# Patient Record
Sex: Male | Born: 1970
Health system: Southern US, Community
[De-identification: ages and names within clinical notes are randomized; demographics above are authoritative.]

## PROBLEM LIST (undated history)

## (undated) DIAGNOSIS — Z87442 Personal history of urinary calculi: Secondary | ICD-10-CM

## (undated) DIAGNOSIS — G43909 Migraine, unspecified, not intractable, without status migrainosus: Secondary | ICD-10-CM

## (undated) DIAGNOSIS — M1990 Unspecified osteoarthritis, unspecified site: Secondary | ICD-10-CM

## (undated) DIAGNOSIS — C189 Malignant neoplasm of colon, unspecified: Secondary | ICD-10-CM

## (undated) DIAGNOSIS — I10 Essential (primary) hypertension: Secondary | ICD-10-CM

## (undated) DIAGNOSIS — E119 Type 2 diabetes mellitus without complications: Secondary | ICD-10-CM

## (undated) HISTORY — PX: TONSILLECTOMY AND ADENOIDECTOMY: SHX28

## (undated) HISTORY — DX: Essential (primary) hypertension: I10

## (undated) HISTORY — DX: Migraine, unspecified, not intractable, without status migrainosus: G43.909

## (undated) HISTORY — DX: Malignant neoplasm of colon, unspecified: C18.9

## (undated) HISTORY — PX: TYMPANOSTOMY TUBE PLACEMENT: SHX32

## (undated) HISTORY — DX: Type 2 diabetes mellitus without complications: E11.9

## (undated) HISTORY — PX: APPENDECTOMY: SHX54

---

## 2006-06-03 ENCOUNTER — Ambulatory Visit: Payer: Self-pay | Admitting: Family Medicine

## 2006-12-20 ENCOUNTER — Ambulatory Visit: Payer: Self-pay | Admitting: Family Medicine

## 2007-01-02 ENCOUNTER — Ambulatory Visit: Payer: Self-pay | Admitting: Family Medicine

## 2007-07-21 ENCOUNTER — Ambulatory Visit: Payer: Self-pay | Admitting: Family Medicine

## 2007-08-23 ENCOUNTER — Ambulatory Visit: Payer: Self-pay | Admitting: Family Medicine

## 2007-09-11 ENCOUNTER — Encounter: Admission: RE | Admit: 2007-09-11 | Discharge: 2007-09-11 | Payer: Self-pay | Admitting: Orthopaedic Surgery

## 2007-10-10 ENCOUNTER — Ambulatory Visit: Payer: Self-pay | Admitting: Family Medicine

## 2007-11-27 ENCOUNTER — Ambulatory Visit: Payer: Self-pay | Admitting: Family Medicine

## 2007-11-30 ENCOUNTER — Encounter: Admission: RE | Admit: 2007-11-30 | Discharge: 2007-11-30 | Payer: Self-pay | Admitting: Gastroenterology

## 2007-12-01 ENCOUNTER — Encounter (INDEPENDENT_AMBULATORY_CARE_PROVIDER_SITE_OTHER): Payer: Self-pay | Admitting: Gastroenterology

## 2007-12-01 ENCOUNTER — Ambulatory Visit: Payer: Self-pay | Admitting: Hematology & Oncology

## 2007-12-01 ENCOUNTER — Inpatient Hospital Stay (HOSPITAL_COMMUNITY): Admission: AD | Admit: 2007-12-01 | Discharge: 2007-12-07 | Payer: Self-pay | Admitting: Gastroenterology

## 2007-12-02 ENCOUNTER — Encounter (INDEPENDENT_AMBULATORY_CARE_PROVIDER_SITE_OTHER): Payer: Self-pay | Admitting: Surgery

## 2007-12-07 ENCOUNTER — Ambulatory Visit: Payer: Self-pay | Admitting: Hematology & Oncology

## 2007-12-08 ENCOUNTER — Ambulatory Visit: Payer: Self-pay | Admitting: Hematology & Oncology

## 2007-12-11 ENCOUNTER — Ambulatory Visit: Admission: RE | Admit: 2007-12-11 | Discharge: 2008-02-06 | Payer: Self-pay | Admitting: Radiation Oncology

## 2007-12-12 LAB — CBC WITH DIFFERENTIAL (CANCER CENTER ONLY)
BASO#: 0.3 10*3/uL — ABNORMAL HIGH (ref 0.0–0.2)
EOS%: 1.5 % (ref 0.0–7.0)
HCT: 43.7 % (ref 38.7–49.9)
HGB: 14.5 g/dL (ref 13.0–17.1)
LYMPH#: 2.1 10*3/uL (ref 0.9–3.3)
LYMPH%: 10 % — ABNORMAL LOW (ref 14.0–48.0)
MCH: 30.8 pg (ref 28.0–33.4)
MCHC: 33.3 g/dL (ref 32.0–35.9)
MCV: 92 fL (ref 82–98)
MONO%: 4.9 % (ref 0.0–13.0)
NEUT%: 82.4 % — ABNORMAL HIGH (ref 40.0–80.0)

## 2007-12-12 LAB — COMPREHENSIVE METABOLIC PANEL
ALT: 17 U/L (ref 0–53)
AST: 16 U/L (ref 0–37)
Albumin: 3.6 g/dL (ref 3.5–5.2)
Calcium: 9.3 mg/dL (ref 8.4–10.5)
Chloride: 93 mEq/L — ABNORMAL LOW (ref 96–112)
Creatinine, Ser: 0.81 mg/dL (ref 0.40–1.50)
Potassium: 3.8 mEq/L (ref 3.5–5.3)
Sodium: 134 mEq/L — ABNORMAL LOW (ref 135–145)
Total Protein: 6.8 g/dL (ref 6.0–8.3)

## 2007-12-19 LAB — BASIC METABOLIC PANEL
Calcium: 8.4 mg/dL (ref 8.4–10.5)
Glucose, Bld: 125 mg/dL — ABNORMAL HIGH (ref 70–99)
Potassium: 4 mEq/L (ref 3.5–5.3)
Sodium: 135 mEq/L (ref 135–145)

## 2007-12-19 LAB — MANUAL DIFFERENTIAL (CHCC SATELLITE)
ALC: 0.4 10*3/uL — ABNORMAL LOW (ref 0.9–3.3)
ANC (CHCC HP manual diff): 6.3 10*3/uL (ref 1.5–6.5)
Band Neutrophils: 11 % — ABNORMAL HIGH (ref 0–10)
Myelocytes: 2 % — ABNORMAL HIGH (ref 0–0)
PLT EST ~~LOC~~: ADEQUATE

## 2007-12-19 LAB — CBC WITH DIFFERENTIAL (CANCER CENTER ONLY)
HCT: 45.4 % (ref 38.7–49.9)
HGB: 15.4 g/dL (ref 13.0–17.1)
MCH: 30.9 pg (ref 28.0–33.4)
MCV: 91 fL (ref 82–98)
RBC: 4.98 10*6/uL (ref 4.20–5.70)
WBC: 7.3 10*3/uL (ref 4.0–10.0)

## 2007-12-26 LAB — CBC WITH DIFFERENTIAL (CANCER CENTER ONLY)
BASO#: 0.1 10*3/uL (ref 0.0–0.2)
EOS%: 3.7 % (ref 0.0–7.0)
Eosinophils Absolute: 0.3 10*3/uL (ref 0.0–0.5)
HGB: 14.2 g/dL (ref 13.0–17.1)
LYMPH#: 1 10*3/uL (ref 0.9–3.3)
MCH: 30.5 pg (ref 28.0–33.4)
MONO%: 9.4 % (ref 0.0–13.0)
NEUT#: 5.3 10*3/uL (ref 1.5–6.5)
Platelets: 180 10*3/uL (ref 145–400)
RBC: 4.66 10*6/uL (ref 4.20–5.70)

## 2007-12-26 LAB — COMPREHENSIVE METABOLIC PANEL
ALT: 20 U/L (ref 0–53)
AST: 14 U/L (ref 0–37)
Albumin: 3.3 g/dL — ABNORMAL LOW (ref 3.5–5.2)
Alkaline Phosphatase: 98 U/L (ref 39–117)
BUN: 13 mg/dL (ref 6–23)
Chloride: 96 mEq/L (ref 96–112)
Potassium: 3.8 mEq/L (ref 3.5–5.3)

## 2007-12-26 LAB — UA PROTEIN, DIPSTICK - CHCC SATELLITE: Protein, Urine: 30 mg/dL

## 2007-12-28 ENCOUNTER — Ambulatory Visit: Payer: Self-pay | Admitting: Family Medicine

## 2008-01-09 LAB — URINALYSIS, MICROSCOPIC (CHCC SATELLITE)
Ketones: NEGATIVE mg/dL
Nitrite: NEGATIVE
Protein: <30 mg/dL
Specific Gravity, Urine: 1.02 (ref 1.003–1.035)
pH: 6 (ref 4.60–8.00)

## 2008-01-09 LAB — CBC WITH DIFFERENTIAL (CANCER CENTER ONLY)
BASO#: 0 10e3/uL (ref 0.0–0.2)
BASO%: 0.5 % (ref 0.0–2.0)
EOS%: 8.4 % — ABNORMAL HIGH (ref 0.0–7.0)
Eosinophils Absolute: 0.4 10e3/uL (ref 0.0–0.5)
HCT: 38.5 % — ABNORMAL LOW (ref 38.7–49.9)
HGB: 13.4 g/dL (ref 13.0–17.1)
LYMPH#: 0.6 10e3/uL — ABNORMAL LOW (ref 0.9–3.3)
LYMPH%: 13.3 % — ABNORMAL LOW (ref 14.0–48.0)
MCH: 32 pg (ref 28.0–33.4)
MCHC: 34.9 g/dL (ref 32.0–35.9)
MCV: 92 fL (ref 82–98)
MONO#: 0.5 10e3/uL (ref 0.1–0.9)
MONO%: 10.7 % (ref 0.0–13.0)
NEUT#: 2.8 10e3/uL (ref 1.5–6.5)
NEUT%: 67.1 % (ref 40.0–80.0)
Platelets: 116 10e3/uL — ABNORMAL LOW (ref 145–400)
RBC: 4.19 10e6/uL — ABNORMAL LOW (ref 4.20–5.70)
RDW: 11 % (ref 10.5–14.6)
WBC: 4.2 10e3/uL (ref 4.0–10.0)

## 2008-01-09 LAB — COMPREHENSIVE METABOLIC PANEL
ALT: 35 U/L (ref 0–53)
AST: 29 U/L (ref 0–37)
Albumin: 3.6 g/dL (ref 3.5–5.2)
CO2: 26 mEq/L (ref 19–32)
Calcium: 8.8 mg/dL (ref 8.4–10.5)
Chloride: 100 mEq/L (ref 96–112)
Potassium: 3.7 mEq/L (ref 3.5–5.3)
Total Protein: 5.8 g/dL — ABNORMAL LOW (ref 6.0–8.3)

## 2008-01-16 LAB — BASIC METABOLIC PANEL - CANCER CENTER ONLY
Calcium: 9.1 mg/dL (ref 8.0–10.3)
Creat: 0.7 mg/dl (ref 0.6–1.2)

## 2008-01-16 LAB — CBC WITH DIFFERENTIAL (CANCER CENTER ONLY)
BASO#: 0 10*3/uL (ref 0.0–0.2)
EOS%: 9.3 % — ABNORMAL HIGH (ref 0.0–7.0)
HGB: 12.7 g/dL — ABNORMAL LOW (ref 13.0–17.1)
MCH: 32.3 pg (ref 28.0–33.4)
MCHC: 35.3 g/dL (ref 32.0–35.9)
MONO%: 11.9 % (ref 0.0–13.0)
NEUT#: 1.8 10*3/uL (ref 1.5–6.5)
RDW: 11.5 % (ref 10.5–14.6)

## 2008-01-23 ENCOUNTER — Ambulatory Visit: Payer: Self-pay | Admitting: Hematology & Oncology

## 2008-01-23 LAB — CBC WITH DIFFERENTIAL (CANCER CENTER ONLY)
BASO#: 0 10*3/uL (ref 0.0–0.2)
Eosinophils Absolute: 0.2 10*3/uL (ref 0.0–0.5)
HGB: 12.4 g/dL — ABNORMAL LOW (ref 13.0–17.1)
MCV: 94 fL (ref 82–98)
MONO#: 0.5 10*3/uL (ref 0.1–0.9)
NEUT#: 1.7 10*3/uL (ref 1.5–6.5)
Platelets: 177 10*3/uL (ref 145–400)
RBC: 3.83 10*6/uL — ABNORMAL LOW (ref 4.20–5.70)
WBC: 3.1 10*3/uL — ABNORMAL LOW (ref 4.0–10.0)

## 2008-03-04 ENCOUNTER — Ambulatory Visit (HOSPITAL_COMMUNITY): Admission: RE | Admit: 2008-03-04 | Discharge: 2008-03-04 | Payer: Self-pay | Admitting: Hematology & Oncology

## 2008-03-08 LAB — CBC WITH DIFFERENTIAL (CANCER CENTER ONLY)
BASO#: 0.1 10e3/uL (ref 0.0–0.2)
BASO%: 1.2 % (ref 0.0–2.0)
EOS%: 3.1 % (ref 0.0–7.0)
Eosinophils Absolute: 0.1 10e3/uL (ref 0.0–0.5)
HCT: 42.8 % (ref 38.7–49.9)
HGB: 15 g/dL (ref 13.0–17.1)
LYMPH#: 1.1 10e3/uL (ref 0.9–3.3)
LYMPH%: 29.1 % (ref 14.0–48.0)
MCH: 33.1 pg (ref 28.0–33.4)
MCHC: 35 g/dL (ref 32.0–35.9)
MCV: 95 fL (ref 82–98)
MONO#: 0.3 10e3/uL (ref 0.1–0.9)
MONO%: 8.5 % (ref 0.0–13.0)
NEUT#: 2.2 10e3/uL (ref 1.5–6.5)
NEUT%: 58.1 % (ref 40.0–80.0)
Platelets: 197 10e3/uL (ref 145–400)
RBC: 4.51 10e6/uL (ref 4.20–5.70)
RDW: 12.3 % (ref 10.5–14.6)
WBC: 3.8 10e3/uL — ABNORMAL LOW (ref 4.0–10.0)

## 2008-03-08 LAB — COMPREHENSIVE METABOLIC PANEL
AST: 24 U/L (ref 0–37)
Albumin: 4.3 g/dL (ref 3.5–5.2)
BUN: 14 mg/dL (ref 6–23)
CO2: 26 mEq/L (ref 19–32)
Calcium: 8.9 mg/dL (ref 8.4–10.5)
Chloride: 106 mEq/L (ref 96–112)
Creatinine, Ser: 0.67 mg/dL (ref 0.40–1.50)
Glucose, Bld: 80 mg/dL (ref 70–99)
Potassium: 3.7 mEq/L (ref 3.5–5.3)

## 2008-04-01 HISTORY — PX: COLON SURGERY: SHX602

## 2008-05-01 ENCOUNTER — Ambulatory Visit: Payer: Self-pay | Admitting: Hematology & Oncology

## 2008-05-02 LAB — COMPREHENSIVE METABOLIC PANEL
ALT: 13 U/L (ref 0–53)
AST: 16 U/L (ref 0–37)
Albumin: 4.1 g/dL (ref 3.5–5.2)
Alkaline Phosphatase: 74 U/L (ref 39–117)
BUN: 13 mg/dL (ref 6–23)
Calcium: 9 mg/dL (ref 8.4–10.5)
Chloride: 101 mEq/L (ref 96–112)
Potassium: 3.9 mEq/L (ref 3.5–5.3)
Sodium: 138 mEq/L (ref 135–145)
Total Protein: 6.5 g/dL (ref 6.0–8.3)

## 2008-05-02 LAB — CBC WITH DIFFERENTIAL (CANCER CENTER ONLY)
BASO#: 0 10*3/uL (ref 0.0–0.2)
BASO%: 0.3 % (ref 0.0–2.0)
HCT: 39.8 % (ref 38.7–49.9)
HGB: 13.8 g/dL (ref 13.0–17.1)
LYMPH#: 1.1 10*3/uL (ref 0.9–3.3)
LYMPH%: 22.2 % (ref 14.0–48.0)
MCV: 92 fL (ref 82–98)
MONO#: 0.4 10*3/uL (ref 0.1–0.9)
NEUT%: 66.4 % (ref 40.0–80.0)
RDW: 10.8 % (ref 10.5–14.6)
WBC: 4.7 10*3/uL (ref 4.0–10.0)

## 2008-05-02 LAB — URINALYSIS, MICROSCOPIC (CHCC SATELLITE)
Ketones: NEGATIVE mg/dL
Nitrite: NEGATIVE
Protein: <30 mg/dL
pH: 7 (ref 4.60–8.00)

## 2008-06-05 LAB — CBC WITH DIFFERENTIAL (CANCER CENTER ONLY)
Eosinophils Absolute: 0.1 10*3/uL (ref 0.0–0.5)
HCT: 44.3 % (ref 38.7–49.9)
LYMPH#: 1.2 10*3/uL (ref 0.9–3.3)
LYMPH%: 23.3 % (ref 14.0–48.0)
MCV: 90 fL (ref 82–98)
MONO#: 0.4 10*3/uL (ref 0.1–0.9)
NEUT%: 67.2 % (ref 40.0–80.0)
RBC: 4.91 10*6/uL (ref 4.20–5.70)
WBC: 5.3 10*3/uL (ref 4.0–10.0)

## 2008-06-05 LAB — COMPREHENSIVE METABOLIC PANEL
ALT: 15 U/L (ref 0–53)
BUN: 17 mg/dL (ref 6–23)
CO2: 25 mEq/L (ref 19–32)
Calcium: 9.2 mg/dL (ref 8.4–10.5)
Creatinine, Ser: 0.76 mg/dL (ref 0.40–1.50)
Total Bilirubin: 0.6 mg/dL (ref 0.3–1.2)

## 2008-06-05 LAB — CEA: CEA: <0.5 ng/mL (ref 0.0–5.0)

## 2008-06-13 LAB — CBC WITH DIFFERENTIAL (CANCER CENTER ONLY)
BASO#: 0 10*3/uL (ref 0.0–0.2)
BASO%: 0.6 % (ref 0.0–2.0)
EOS%: 2.3 % (ref 0.0–7.0)
HCT: 41.9 % (ref 38.7–49.9)
HGB: 14.7 g/dL (ref 13.0–17.1)
LYMPH#: 1 10*3/uL (ref 0.9–3.3)
LYMPH%: 25.6 % (ref 14.0–48.0)
MCH: 31.7 pg (ref 28.0–33.4)
MCHC: 35.2 g/dL (ref 32.0–35.9)
MCV: 90 fL (ref 82–98)
MONO%: 7.6 % (ref 0.0–13.0)
NEUT%: 63.9 % (ref 40.0–80.0)
RDW: 11.4 % (ref 10.5–14.6)

## 2008-07-03 ENCOUNTER — Ambulatory Visit: Payer: Self-pay | Admitting: Hematology & Oncology

## 2008-07-25 LAB — CBC WITH DIFFERENTIAL (CANCER CENTER ONLY)
BASO#: 0 10*3/uL (ref 0.0–0.2)
EOS%: 6.6 % (ref 0.0–7.0)
HGB: 14.1 g/dL (ref 13.0–17.1)
MCH: 33 pg (ref 28.0–33.4)
MCHC: 34.3 g/dL (ref 32.0–35.9)
MONO%: 17.9 % — ABNORMAL HIGH (ref 0.0–13.0)
NEUT#: 0.7 10*3/uL — ABNORMAL LOW (ref 1.5–6.5)

## 2008-07-25 LAB — COMPREHENSIVE METABOLIC PANEL
Albumin: 4.2 g/dL (ref 3.5–5.2)
BUN: 15 mg/dL (ref 6–23)
CO2: 24 mEq/L (ref 19–32)
Calcium: 8.7 mg/dL (ref 8.4–10.5)
Chloride: 105 mEq/L (ref 96–112)
Glucose, Bld: 99 mg/dL (ref 70–99)
Potassium: 4.1 mEq/L (ref 3.5–5.3)
Sodium: 139 mEq/L (ref 135–145)
Total Protein: 6.4 g/dL (ref 6.0–8.3)

## 2008-08-01 LAB — CBC WITH DIFFERENTIAL (CANCER CENTER ONLY)
BASO%: 0.5 % (ref 0.0–2.0)
EOS%: 2.4 % (ref 0.0–7.0)
Eosinophils Absolute: 0.1 10*3/uL (ref 0.0–0.5)
MCH: 33.5 pg — ABNORMAL HIGH (ref 28.0–33.4)
MCHC: 34.6 g/dL (ref 32.0–35.9)
MONO%: 19.7 % — ABNORMAL HIGH (ref 0.0–13.0)
NEUT#: 1 10*3/uL — ABNORMAL LOW (ref 1.5–6.5)
Platelets: 127 10*3/uL — ABNORMAL LOW (ref 145–400)
RBC: 4.13 10*6/uL — ABNORMAL LOW (ref 4.20–5.70)
RDW: 13.9 % (ref 10.5–14.6)

## 2008-08-01 LAB — CMP (CANCER CENTER ONLY)
ALT(SGPT): 43 U/L (ref 10–47)
AST: 46 U/L — ABNORMAL HIGH (ref 11–38)
Albumin: 3.7 g/dL (ref 3.3–5.5)
Alkaline Phosphatase: 78 U/L (ref 26–84)
Calcium: 9.3 mg/dL (ref 8.0–10.3)
Chloride: 98 mEq/L (ref 98–108)
Potassium: 3.8 mEq/L (ref 3.3–4.7)

## 2008-08-15 LAB — CBC WITH DIFFERENTIAL (CANCER CENTER ONLY)
BASO%: 1 % (ref 0.0–2.0)
Eosinophils Absolute: 0.1 10*3/uL (ref 0.0–0.5)
LYMPH#: 1 10*3/uL (ref 0.9–3.3)
MONO#: 0.4 10*3/uL (ref 0.1–0.9)
NEUT#: 1.4 10*3/uL — ABNORMAL LOW (ref 1.5–6.5)
Platelets: 168 10*3/uL (ref 145–400)
RBC: 4.41 10*6/uL (ref 4.20–5.70)
WBC: 2.9 10*3/uL — ABNORMAL LOW (ref 4.0–10.0)

## 2008-09-10 ENCOUNTER — Ambulatory Visit (HOSPITAL_COMMUNITY): Admission: RE | Admit: 2008-09-10 | Discharge: 2008-09-10 | Payer: Self-pay | Admitting: Hematology & Oncology

## 2008-09-26 ENCOUNTER — Ambulatory Visit: Payer: Self-pay | Admitting: Hematology & Oncology

## 2008-09-27 LAB — CBC WITH DIFFERENTIAL (CANCER CENTER ONLY)
BASO#: 0 10*3/uL (ref 0.0–0.2)
EOS%: 1.6 % (ref 0.0–7.0)
HCT: 43.9 % (ref 38.7–49.9)
HGB: 15.1 g/dL (ref 13.0–17.1)
LYMPH#: 1 10*3/uL (ref 0.9–3.3)
MCH: 34.9 pg — ABNORMAL HIGH (ref 28.0–33.4)
MCHC: 34.4 g/dL (ref 32.0–35.9)
MCV: 101 fL — ABNORMAL HIGH (ref 82–98)
MONO%: 7.3 % (ref 0.0–13.0)
NEUT%: 61 % (ref 40.0–80.0)

## 2008-09-27 LAB — COMPREHENSIVE METABOLIC PANEL
ALT: 22 U/L (ref 0–53)
AST: 19 U/L (ref 0–37)
Alkaline Phosphatase: 77 U/L (ref 39–117)
Chloride: 105 mEq/L (ref 96–112)
Creatinine, Ser: 0.66 mg/dL (ref 0.40–1.50)
Total Bilirubin: 0.4 mg/dL (ref 0.3–1.2)

## 2008-11-08 ENCOUNTER — Ambulatory Visit: Payer: Self-pay | Admitting: Family Medicine

## 2008-12-18 ENCOUNTER — Ambulatory Visit (HOSPITAL_COMMUNITY): Admission: RE | Admit: 2008-12-18 | Discharge: 2008-12-18 | Payer: Self-pay | Admitting: Hematology & Oncology

## 2008-12-26 ENCOUNTER — Ambulatory Visit: Payer: Self-pay | Admitting: Hematology & Oncology

## 2008-12-27 LAB — COMPREHENSIVE METABOLIC PANEL
ALT: 29 U/L (ref 0–53)
AST: 23 U/L (ref 0–37)
Albumin: 4.3 g/dL (ref 3.5–5.2)
BUN: 17 mg/dL (ref 6–23)
Calcium: 9.3 mg/dL (ref 8.4–10.5)
Chloride: 104 mEq/L (ref 96–112)
Potassium: 4.2 mEq/L (ref 3.5–5.3)
Sodium: 140 mEq/L (ref 135–145)
Total Protein: 6.6 g/dL (ref 6.0–8.3)

## 2008-12-27 LAB — CBC WITH DIFFERENTIAL (CANCER CENTER ONLY)
BASO#: 0 10*3/uL (ref 0.0–0.2)
HCT: 45 % (ref 38.7–49.9)
HGB: 15.6 g/dL (ref 13.0–17.1)
LYMPH#: 1.3 10*3/uL (ref 0.9–3.3)
MONO#: 0.2 10*3/uL (ref 0.1–0.9)
NEUT%: 49.9 % (ref 40.0–80.0)
WBC: 3.4 10*3/uL — ABNORMAL LOW (ref 4.0–10.0)

## 2009-02-06 ENCOUNTER — Ambulatory Visit: Payer: Self-pay | Admitting: Hematology & Oncology

## 2009-03-21 ENCOUNTER — Ambulatory Visit (HOSPITAL_COMMUNITY): Admission: RE | Admit: 2009-03-21 | Discharge: 2009-03-21 | Payer: Self-pay | Admitting: Hematology & Oncology

## 2009-03-25 ENCOUNTER — Ambulatory Visit: Payer: Self-pay | Admitting: Hematology & Oncology

## 2009-03-26 LAB — CBC WITH DIFFERENTIAL (CANCER CENTER ONLY)
BASO%: 0.6 % (ref 0.0–2.0)
Eosinophils Absolute: 0.1 10*3/uL (ref 0.0–0.5)
HCT: 45.5 % (ref 38.7–49.9)
LYMPH#: 1.6 10*3/uL (ref 0.9–3.3)
MONO#: 0.3 10*3/uL (ref 0.1–0.9)
NEUT%: 56.4 % (ref 40.0–80.0)
Platelets: 171 10*3/uL (ref 145–400)
RBC: 4.96 10*6/uL (ref 4.20–5.70)
RDW: 11.9 % (ref 10.5–14.6)
WBC: 4.5 10*3/uL (ref 4.0–10.0)

## 2009-03-26 LAB — COMPREHENSIVE METABOLIC PANEL
AST: 18 U/L (ref 0–37)
BUN: 13 mg/dL (ref 6–23)
Calcium: 9 mg/dL (ref 8.4–10.5)
Chloride: 106 mEq/L (ref 96–112)
Creatinine, Ser: 0.94 mg/dL (ref 0.40–1.50)

## 2009-04-19 HISTORY — PX: COLON SURGERY: SHX602

## 2009-04-25 ENCOUNTER — Ambulatory Visit (HOSPITAL_COMMUNITY): Admission: RE | Admit: 2009-04-25 | Discharge: 2009-04-25 | Payer: Self-pay | Admitting: Surgery

## 2009-04-30 IMAGING — PT NM PET TUM IMG SKULL BASE T - THIGH
6 series · 25 of 25 positions shown · non-contrast
Comparison: None

CLINICAL DATA: Obstructing colon cancer

NUCLEAR MEDICINE PET SKULL BASE TO THIGH
TECHNIQUE: 15 mCi F-18 FDG was injected intravenously via the
right hand.  Full-ring PET imaging was performed from the skull
base through the mid-thighs 75  minutes after injection.  CT data
was obtained and used for attenuation correction and anatomic
localization only.  (This was not acquired as a diagnostic CT
examination.)
Fasting Blood Glucose:  122

[Series 1: pet ac · axial · 3.3mm · 4.69mm/px · z∈[-870,+0]mm · 5 of 267 slices shown]
[im 1/267]
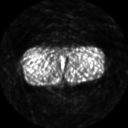
[im 67/267]
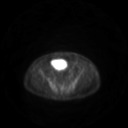
[im 134/267]
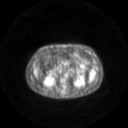
[im 200/267]
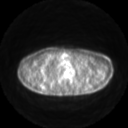
[im 267/267]
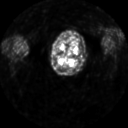

[Series 2: pet nac · axial · 3.3mm · 4.69mm/px · z∈[-870,+0]mm · 6 of 267 slices shown]
[im 1/267]
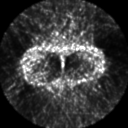
[im 54/267]
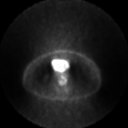
[im 107/267]
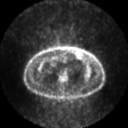
[im 160/267]
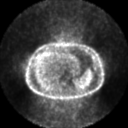
[im 213/267]
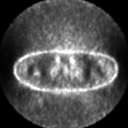
[im 267/267]
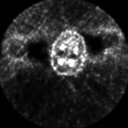

[Series 2: ct images · axial · 3.8mm · 0.98mm/px · z∈[-870,+0]mm · 5 of 263 slices shown]
[im 1/263]
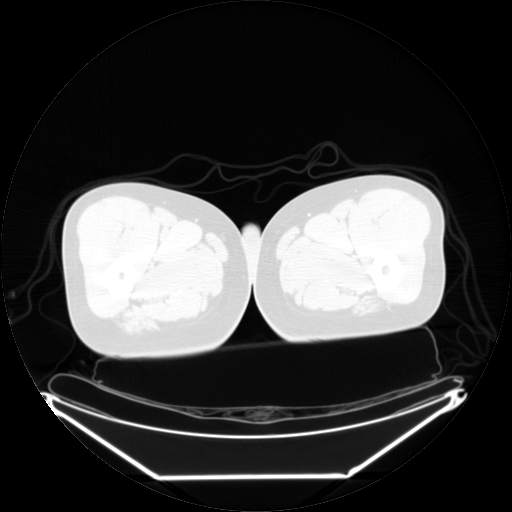
[im 66/263]
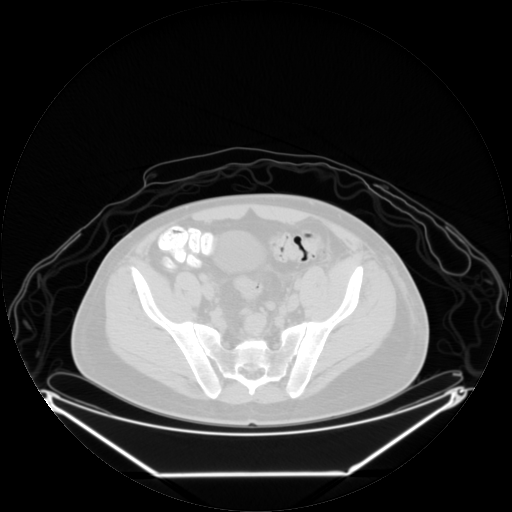
[im 132/263]
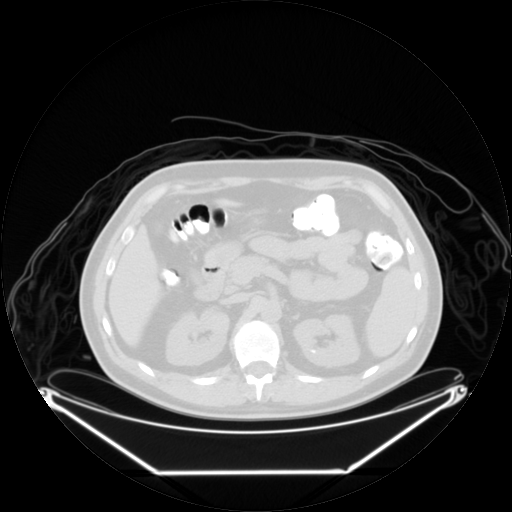
[im 197/263]
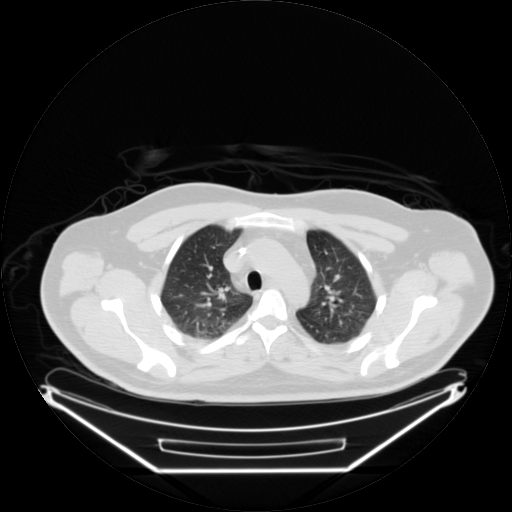
[im 263/263]
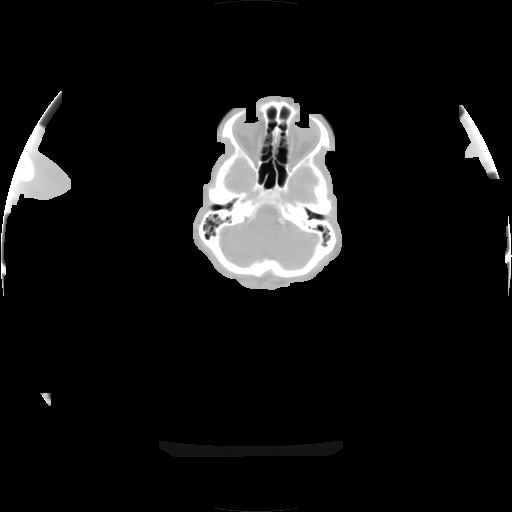

[Series 123: mip · coronal · 3.3mm · 4.69mm/px · 1 of 30 slices shown]
[im 1/30]
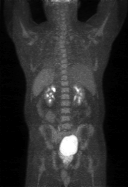

[Series 151: reformatted · axial · 3.3mm · 3.91mm/px · z∈[-870,+0]mm · 6 of 265 slices shown (1 of 2)]
[im 1/265]
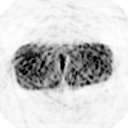
[im 53/265]
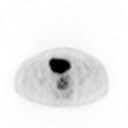
[im 106/265]
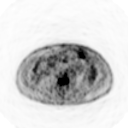
[im 159/265]
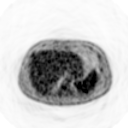
[im 212/265]
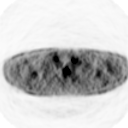
[im 265/265]
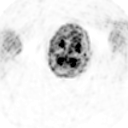

[Series 154: reformatted · coronal · 4.7mm · 6.98mm/px · 2 of 73 slices shown (2 of 2)]
[im 1/73]
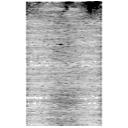
[im 73/73]
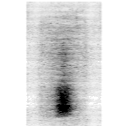

[25 of 25 positions shown; findings below may reference images not displayed]

FINDINGS: There is no hypermetabolic lymph nodes within the soft tissues of
the neck.

No hypermetabolic supraclavicular, axillary, mediastinal, or hilar
lymph nodes.

There is airspace consolidation and atelectasis within the left
base.

No hypermetabolic pulmonary nodule or mass is identified.

There is no abnormal FDG uptake within the liver parenchyma.

The adrenal glands are negative.

There is mild increased uptake within the splenic parenchyma.  The
spleen measures 14 cm in length.

The adrenal glands are negative.

The pancreas is negative.

There is no abnormal increased uptake within the retroperitoneal or
small bowel mesentery.

There is a left abdominal colostomy.

There is a small amount of free fluid within the left pericolic
gutter.  No hypermetabolic peritoneal nodules or masses noted.

Large rectal mass measures 7.2 cm and has intense F D G uptake with
an SUV max equal to 14.6.

Small pelvic lymph nodes are identified bilaterally and exhibit
abnormal F D G uptake.  For example, below the bifurcation of the
abdominal aorta there is a 12 mm lymph node which has an SUV max
equal to 4.7.

A left external iliac lymph node measures 8 mm and has an SUV max
equal to 3.7.

There is diffuse abnormal increased FDG uptake throughout the bone
marrow of the visualized axial and appendicular skeleton.  No focal
areas of dominant uptake are identified within the bone marrow.On
the corresponding CT images there are no suspicious lytic or
sclerotic lesions.
IMPRESSION: 1.  Large rectal mass is intensely hypermetabolic consistent with
primary rectal neoplasm.
2.  Borderline enlarged pelvic lymph nodes exhibit abnormal
increased F D G uptake and are concerning for metastatic disease.
3.  Diffuse abnormal increased radiotracer uptake throughout the
bone marrow.  The appearance is nonspecific and is most typically
seen in patients who have stimulated bone marrow.  In the absence
of bone marrow stimulation or exposure to chemotherapy agents
diffuse bone marrow involvement by tumor cannot be excluded.
Careful clinical correlation is advised.
4.  Splenomegaly with diffuse increased uptake throughout the
splenic parenchyma.  This may also be seen in the setting of the
stimulated bone marrow.  Splenic metastasis is considered less
favored.

## 2009-06-06 ENCOUNTER — Ambulatory Visit (HOSPITAL_COMMUNITY): Admission: RE | Admit: 2009-06-06 | Discharge: 2009-06-06 | Payer: Self-pay | Admitting: Hematology & Oncology

## 2009-06-10 ENCOUNTER — Ambulatory Visit: Payer: Self-pay | Admitting: Hematology & Oncology

## 2009-06-11 LAB — COMPREHENSIVE METABOLIC PANEL
ALT: 21 U/L (ref 0–53)
AST: 21 U/L (ref 0–37)
Albumin: 4.6 g/dL (ref 3.5–5.2)
BUN: 23 mg/dL (ref 6–23)
CO2: 29 mEq/L (ref 19–32)
Calcium: 9.4 mg/dL (ref 8.4–10.5)
Chloride: 102 mEq/L (ref 96–112)
Creatinine, Ser: 1 mg/dL (ref 0.40–1.50)
Potassium: 4.6 mEq/L (ref 3.5–5.3)

## 2009-06-11 LAB — CBC WITH DIFFERENTIAL (CANCER CENTER ONLY)
BASO%: 0.8 % (ref 0.0–2.0)
Eosinophils Absolute: 0.1 10*3/uL (ref 0.0–0.5)
HCT: 49.9 % (ref 38.7–49.9)
HGB: 16.9 g/dL (ref 13.0–17.1)
LYMPH#: 1.5 10*3/uL (ref 0.9–3.3)
LYMPH%: 35.5 % (ref 14.0–48.0)
MCV: 97 fL (ref 82–98)
MONO#: 0.3 10*3/uL (ref 0.1–0.9)
NEUT%: 55.7 % (ref 40.0–80.0)
RBC: 5.14 10*6/uL (ref 4.20–5.70)
RDW: 11.1 % (ref 10.5–14.6)
WBC: 4.4 10*3/uL (ref 4.0–10.0)

## 2009-06-11 LAB — VITAMIN D 25 HYDROXY (VIT D DEFICIENCY, FRACTURES): Vit D, 25-Hydroxy: 16 ng/mL — ABNORMAL LOW (ref 30–89)

## 2009-09-22 ENCOUNTER — Ambulatory Visit (HOSPITAL_COMMUNITY): Admission: RE | Admit: 2009-09-22 | Discharge: 2009-09-22 | Payer: Self-pay | Admitting: Hematology & Oncology

## 2009-09-29 ENCOUNTER — Ambulatory Visit: Payer: Self-pay | Admitting: Hematology & Oncology

## 2009-10-01 LAB — CBC WITH DIFFERENTIAL (CANCER CENTER ONLY)
EOS%: 3 % (ref 0.0–7.0)
LYMPH#: 1.6 10*3/uL (ref 0.9–3.3)
MCH: 32.5 pg (ref 28.0–33.4)
MCHC: 34.3 g/dL (ref 32.0–35.9)
MONO%: 6.4 % (ref 0.0–13.0)
NEUT#: 3 10*3/uL (ref 1.5–6.5)
Platelets: 217 10*3/uL (ref 145–400)
RBC: 4.8 10*6/uL (ref 4.20–5.70)

## 2009-10-01 LAB — COMPREHENSIVE METABOLIC PANEL
ALT: 22 U/L (ref 0–53)
AST: 20 U/L (ref 0–37)
Alkaline Phosphatase: 66 U/L (ref 39–117)
Potassium: 5 mEq/L (ref 3.5–5.3)
Sodium: 140 mEq/L (ref 135–145)
Total Bilirubin: 0.4 mg/dL (ref 0.3–1.2)
Total Protein: 6.5 g/dL (ref 6.0–8.3)

## 2009-10-01 LAB — CEA: CEA: 0.7 ng/mL (ref 0.0–5.0)

## 2010-02-03 ENCOUNTER — Ambulatory Visit: Payer: Self-pay | Admitting: Hematology & Oncology

## 2010-06-14 ENCOUNTER — Encounter: Payer: Self-pay | Admitting: Hematology & Oncology

## 2010-06-15 ENCOUNTER — Encounter: Payer: Self-pay | Admitting: Orthopaedic Surgery

## 2010-07-23 ENCOUNTER — Encounter: Payer: Self-pay | Admitting: Internal Medicine

## 2010-07-23 ENCOUNTER — Ambulatory Visit (INDEPENDENT_AMBULATORY_CARE_PROVIDER_SITE_OTHER): Payer: BC Managed Care – PPO | Admitting: Internal Medicine

## 2010-07-23 DIAGNOSIS — G8929 Other chronic pain: Secondary | ICD-10-CM | POA: Insufficient documentation

## 2010-07-23 DIAGNOSIS — I10 Essential (primary) hypertension: Secondary | ICD-10-CM | POA: Insufficient documentation

## 2010-07-23 DIAGNOSIS — R51 Headache: Secondary | ICD-10-CM

## 2010-07-23 DIAGNOSIS — C20 Malignant neoplasm of rectum: Secondary | ICD-10-CM | POA: Insufficient documentation

## 2010-07-23 DIAGNOSIS — J40 Bronchitis, not specified as acute or chronic: Secondary | ICD-10-CM | POA: Insufficient documentation

## 2010-07-23 MED ORDER — DOXYCYCLINE HYCLATE 100 MG PO TABS: 100.0000 mg | ORAL_TABLET | Freq: Two times a day (BID) | ORAL | Status: AC

## 2010-07-23 MED ORDER — VERAPAMIL HCL ER 180 MG PO TBCR: 180.0000 mg | EXTENDED_RELEASE_TABLET | Freq: Every day | ORAL | Status: DC

## 2010-07-23 MED ORDER — SUMATRIPTAN 5 MG/ACT NA SOLN: 1.0000 | NASAL | Status: DC | PRN

## 2010-07-23 NOTE — Assessment & Plan Note (Signed)
Resume verapamil for prophylaxis and RF imitrex spray for prn use. Recommend tobacco cessation.

## 2010-07-23 NOTE — Assessment & Plan Note (Signed)
Suboptimal control. Resume verapamil. Monitor bp as outpt and followup in clinic as scheduled.

## 2010-07-23 NOTE — Assessment & Plan Note (Signed)
Stable, asx and without evidence of recurrence to date. Continue followup with H/O.

## 2010-07-23 NOTE — Progress Notes (Signed)
  Subjective:    Patient ID: Stephen Tanner, male    DOB: 1970/12/07, 40 y.o.   MRN: [id number]  HPI  Pt presents to clinic for evaluation of cough and headaches. Notes >10year h/o chronic intermittent ha's thought to be cluster ha's. Has attempted in the past prednisone, oxygen without improvement and seems to respond to imitrex spray and verapamil prophylaxis. Often notes rapid improvement with verapamil and is able to ultimately dc the medication. Will have several months without ha's followed by return of pattern without obvious trigger. Recently notes return of ha's described as unilateral involved brow above eye and temple with duration of 1-1.5 hours. Reports previous nl cranial imaging.  States has h/o rectal adenocarcinoma followed closely by H/O. Undergoing ~q40month CT scans without evidence of recurrence.   H/o HTN that seems to respond to verapamil when takes the medication for ha prophylaxis. BP elevated and reviewed today but asx. Notes family h/o HTN diffusely. Recalls having nl thyroid blood tests previously.  Notes 2 week h/o cough, post nasal drainage and nasal congestion. Denies fever, chills or sick exposure. No alleviating or exacerbating factors. No improvement of sx's.  Reviewed PMH, PSH, medications, allergies, social hx and family hx.    Review of Systems  Constitutional: Negative for fever, chills and fatigue.  HENT: Positive for congestion, rhinorrhea and postnasal drip. Negative for ear pain, facial swelling and ear discharge.   Eyes: Negative for discharge and redness.  Respiratory: Negative for cough, shortness of breath and wheezing.   Cardiovascular: Negative for chest pain and palpitations.  Gastrointestinal: Negative for abdominal pain, blood in stool and anal bleeding.  Genitourinary: Negative for hematuria, decreased urine volume and difficulty urinating.  Musculoskeletal: Negative for arthralgias and gait problem.  Skin: Negative for color change and rash.    Neurological: Positive for headaches. Negative for dizziness, syncope, weakness and numbness.  Hematological: Negative for adenopathy. Does not bruise/bleed easily.  Psychiatric/Behavioral: Negative for behavioral problems and agitation.       Objective:   Physical Exam  [nursing notereviewed. Constitutional: He appears well-developed and well-nourished. No distress.  HENT:  Head: Normocephalic and atraumatic.  Right Ear: Tympanic membrane, external ear and ear canal normal.  Left Ear: Tympanic membrane, external ear and ear canal normal.  Nose: Nose normal.  Mouth/Throat: Oropharynx is clear and moist. No oropharyngeal exudate.  Eyes: Conjunctivae are normal. Right eye exhibits no discharge. Left eye exhibits no discharge. No scleral icterus.  Neck: Trachea normal. Neck supple.  Cardiovascular: Regular rhythm and normal heart sounds.  Tachycardia present.  Exam reveals no gallop and no friction rub.   No murmur heard. Pulmonary/Chest: Effort normal and breath sounds normal. No respiratory distress. He has no wheezes. He has no rales. He exhibits no tenderness.  Lymphadenopathy:    He has no cervical adenopathy.  Neurological: He is alert. He displays no tremor. Gait normal.  Skin: Skin is warm and dry. No rash noted. He is not diaphoretic. No erythema.  Psychiatric: He has a normal mood and affect.          Assessment & Plan:

## 2010-07-23 NOTE — Assessment & Plan Note (Signed)
Begin 7d course of doxycycline

## 2010-08-12 ENCOUNTER — Telehealth: Payer: Self-pay

## 2010-08-12 NOTE — Telephone Encounter (Signed)
Returned call to pt's wife. Rx given by Dr. Rodena Medin for Imitrex nasal spray was 5 mg and when pt got home he realized that what he previously had 20 mg so he is having to use 3 of the 5 mg sprays at a time. Wants to know if Dr. Rodena Medin will up to dosage to 20 mg

## 2010-08-13 NOTE — Telephone Encounter (Signed)
Ok to increase to 20mg . But the max daily dose i believe for the spray is 40mg 

## 2010-08-14 ENCOUNTER — Other Ambulatory Visit: Payer: Self-pay

## 2010-08-14 DIAGNOSIS — G43909 Migraine, unspecified, not intractable, without status migrainosus: Secondary | ICD-10-CM

## 2010-08-14 MED ORDER — SUMATRIPTAN 20 MG/ACT NA SOLN: 1.0000 | NASAL | Status: DC | PRN

## 2010-08-14 NOTE — Telephone Encounter (Signed)
Done. Pt's wife, Rosann Auerbach,  aware

## 2010-08-25 LAB — CBC
MCHC: 34.7 g/dL (ref 30.0–36.0)
MCV: 94.4 fL (ref 78.0–100.0)
Platelets: 209 10*3/uL (ref 150–400)
RBC: 4.83 MIL/uL (ref 4.22–5.81)
RDW: 13.2 % (ref 11.5–15.5)

## 2010-08-25 LAB — BASIC METABOLIC PANEL
BUN: 21 mg/dL (ref 6–23)
CO2: 29 mEq/L (ref 19–32)
Chloride: 102 mEq/L (ref 96–112)
Creatinine, Ser: 1.04 mg/dL (ref 0.4–1.5)
Glucose, Bld: 164 mg/dL — ABNORMAL HIGH (ref 70–99)

## 2010-08-25 LAB — DIFFERENTIAL
Basophils Absolute: 0 10*3/uL (ref 0.0–0.1)
Basophils Relative: 0 % (ref 0–1)
Eosinophils Absolute: 0.1 10*3/uL (ref 0.0–0.7)
Monocytes Relative: 7 % (ref 3–12)
Neutrophils Relative %: 62 % (ref 43–77)

## 2010-08-30 LAB — GLUCOSE, CAPILLARY: Glucose-Capillary: 113 mg/dL — ABNORMAL HIGH (ref 70–99)

## 2010-09-17 ENCOUNTER — Ambulatory Visit: Payer: BC Managed Care – PPO | Admitting: Internal Medicine

## 2010-09-18 ENCOUNTER — Encounter: Payer: Self-pay | Admitting: Internal Medicine

## 2010-09-18 ENCOUNTER — Ambulatory Visit (INDEPENDENT_AMBULATORY_CARE_PROVIDER_SITE_OTHER): Payer: BC Managed Care – PPO | Admitting: Internal Medicine

## 2010-09-18 DIAGNOSIS — I10 Essential (primary) hypertension: Secondary | ICD-10-CM

## 2010-09-18 DIAGNOSIS — R51 Headache: Secondary | ICD-10-CM

## 2010-09-18 DIAGNOSIS — J40 Bronchitis, not specified as acute or chronic: Secondary | ICD-10-CM

## 2010-09-18 DIAGNOSIS — G8929 Other chronic pain: Secondary | ICD-10-CM

## 2010-09-18 MED ORDER — DOXYCYCLINE HYCLATE 100 MG PO TABS: 100.0000 mg | ORAL_TABLET | Freq: Two times a day (BID) | ORAL | Status: DC

## 2010-09-18 MED ORDER — HYDROCOD POLST-CHLORPHEN POLST 10-8 MG/5ML PO LQCR: 5.0000 mL | Freq: Two times a day (BID) | ORAL | Status: DC | PRN

## 2010-09-18 NOTE — Assessment & Plan Note (Signed)
Off verapamil. Continue low-sodium diet exercise and weight loss. Monitor blood pressure as an outpatient and followup in clinic as scheduled.

## 2010-09-18 NOTE — Assessment & Plan Note (Signed)
Improved. Off verapamil. Continue Imitrex when necessary.

## 2010-09-18 NOTE — Assessment & Plan Note (Signed)
Begin antibiotic therapy. Followup if no improvement or worsening. 

## 2010-09-18 NOTE — Progress Notes (Signed)
  Subjective:    Patient ID: Stephen Tanner, male    DOB: 20-Jul-1970, 40 y.o.   MRN: [id number]  HPI Pt presents to clinic for evaluation of cough. Notes over [age] history of cough productive for green and brown sputum without hemoptysis. Cough is worse at night. Denies fever, chills, wheezing or shortness of breath. No sick exposures. History of migraine and cluster headaches. Has had no further cluster headaches. Imitrex seems to abort migraine headaches. Is now off verapamil and pursuing low-sodium diet exercise and weight loss. Monitor blood pressure and is improved overall. Weight down 5 pounds since last visit and blood pressure improved. Reviewed with patient. No other complaints.  Reviewed past medical history, medications and allergies.    Review of Systems See history of present illness    Objective:   Physical Exam    Physical Exam  [nursing notereviewed. Constitutional:  appears well-developed and well-nourished. No distress.  HENT:  Head: Normocephalic and atraumatic.  Right Ear: Tympanic membrane, external ear and ear canal normal.  Left Ear: Tympanic membrane, external ear and ear canal normal.  Nose: Nose normal.  Mouth/Throat: Oropharynx is clear and moist. No oropharyngeal exudate.  Eyes: Conjunctivae are normal. No scleral icterus.  Neck: Neck supple.  Cardiovascular: Normal rate, regular rhythm and normal heart sounds.  Exam reveals no gallop and no friction rub.   No murmur heard. Pulmonary/Chest: Effort normal and breath sounds normal. No respiratory distress.  no wheezes.  no rales.  Lymphadenopathy:     no cervical adenopathy.  Neurological:  alert.  Skin: Skin is warm and dry.  not diaphoretic.      Assessment & Plan:

## 2010-09-24 ENCOUNTER — Telehealth: Payer: Self-pay | Admitting: Internal Medicine

## 2010-09-24 MED ORDER — LEVOFLOXACIN 500 MG PO TABS: 500.0000 mg | ORAL_TABLET | Freq: Every day | ORAL | Status: AC

## 2010-09-24 NOTE — Telephone Encounter (Signed)
Was seen last week. Not any better. Wants something stronger without coming in. Please call [facility]. Any ? Please call.

## 2010-09-24 NOTE — Telephone Encounter (Signed)
Change doxycycline to levaquin 500mg  po qd x 7days. Check allergies and interactions.

## 2010-09-25 ENCOUNTER — Telehealth: Payer: Self-pay | Admitting: Internal Medicine

## 2010-09-25 NOTE — Telephone Encounter (Signed)
Wife stated that her husband is having more frequent migraines. According to the pharmacy, cannot refill his Imitrex until 10-12-2010. Please advise of his migraines.

## 2010-09-28 ENCOUNTER — Other Ambulatory Visit: Payer: Self-pay | Admitting: Internal Medicine

## 2010-09-28 NOTE — Telephone Encounter (Signed)
Pt is having severe migraine/cluster headaches and is needing to get early refill of Imitrex. Pt says that if this med can not req called in, pt req a different med asap to [facility] on [address].

## 2010-09-28 NOTE — Telephone Encounter (Signed)
Prior Auth for imitrex process has been initiated. Pt's wife aware.

## 2010-10-06 NOTE — Discharge Summary (Signed)
[name], Stephen Tanner NO.:  [id number]   MEDICAL RECORD NO.:  [id number]          PATIENT TYPE:  INP   LOCATION:  1321                         FACILITY:  [facility]   PHYSICIAN:  Jordan Hawks. Elnoria Howard, MD    DATE OF BIRTH:  10-Jan-1971   DATE OF ADMISSION:  12/01/2007  DATE OF DISCHARGE:  12/07/2007                               DISCHARGE SUMMARY   SURGEON:  Wilmon Arms. Corliss Skains, M.D.   ONCOLOGIST:  Rose Phi. Myna Hidalgo, M.D.   RADIATION ONCOLOGIST:  Billie Lade, M.D.   DISCHARGE DIAGNOSES:  1. Rectal cancer.  2. Status post descending colon loop colostomy.  3. Mild appendicitis, status post appendectomy.   HOSPITAL COURSE:  The patient was admitted after his EUS examination  with findings of a T3N1 or possibly N2 rectal cancer.  Biopsy did  confirm the nature of this mass that was noted in the rectum.  After the  finding, surgical consultation was obtained because of obstructive  symptoms.  Subsequently, Dr. Manus Rudd operated on the patient the  following day and performed a loop colostomy.  The procedure went  without any complications.  The patient tolerated the procedure well.  Over the intervening days, he was advanced with his diet and his stoma  did appear to be normal, and the incision sites were healing properly.  He was able to pass flatus into his ostomy bag as well as passing solid  stool into his ostomy bag on the day of discharge.  He was also  evaluated by Dr. Myna Hidalgo and Dr. Roselind Messier who both agreed that he should  have neoadjuvant therapy which will be initiated next week pending his  discharge at this time.  Of note, his white blood cell count was noted  to have decreased, but then on the last 2 days of his hospital course,  his white blood cell count climbed up to 18,000.  On the day of  discharge, it was up to 20,000.  The patient clinically appeared well.  He had no complaints of abdominal pain, nausea or vomiting.  There are  no reports of any  fever.  No issues with diarrhea to suggest C. diff  colitis.  Because the patient clinically appeared well, it is felt that  he could be safely discharged home.   PLAN:  1. Initiate chemo and radiation per Dr. Myna Hidalgo and Dr. Trina Ao      recommendations.  2. The patient is to follow up with Dr. Corliss Skains in 1 week.      Jordan Hawks Elnoria Howard, MD  Electronically Signed     PDH/MEDQ  D:  12/07/2007  T:  12/07/2007  Job:  [id number]   cc:   Wilmon Arms. Corliss Skains, M.D.  [address] [place] [address] [id number]  [place]. Myna Hidalgo, M.D.  Fax: [phone]   Billie Lade, M.D.  Fax: [phone]

## 2010-10-06 NOTE — Op Note (Signed)
Stephen Tanner, NEDVED NO.:  [id number]   MEDICAL RECORD NO.:  [id number]          PATIENT TYPE:  OIB   LOCATION:  1321                         FACILITY:  [facility]   PHYSICIAN:  Wilmon Arms. Corliss Skains, M.D. DATE OF BIRTH:  Feb 22, 1971   DATE OF PROCEDURE:  12/02/2007  DATE OF DISCHARGE:                               OPERATIVE REPORT   PREOPERATIVE DIAGNOSES:  1. Low near-obstructing rectal cancer.  2. Abnormal-appearing appendix.  3. Need for chemotherapy.   POSTOPERATIVE DIAGNOSES:  1. Low near-obstructing rectal cancer.  2. Abnormal-appearing appendix.  3. Need for chemotherapy.   PROCEDURES PERFORMED:  1. Laparoscopic-assisted diverting sigmoid colostomy.  2. Laparoscopic appendectomy.  3. Left subclavian vein PowerPort placement.   SURGEON:  Wilmon Arms. Corliss Skains, MD, FACS   ASSISTANT:  Ardeth Sportsman, MD   ANESTHESIA:  General endotracheal.   INDICATIONS:  The patient is a 40 year old male who presented with  rectal and pelvic pain.  Workup revealed a 6-cm near-obstructing low  rectal mass.  Due to the size of the mass we are consulted for surgical  evaluation.  We are planning neoadjuvant chemotherapy and radiation.  The patient's CT scan also revealed some thickening of the appendix.  We  are planning to divert his fecal stream with a loop sigmoid colostomy  due to his impending obstruction.  We are also asked to place a port.   DESCRIPTION OF PROCEDURE:  The patient brought to the operating room,  placed in supine position on the operating room table with his arms  tucked.  After an adequate level of general anesthesia was obtained, a  Foley catheter was placed under sterile technique.  The patient's  abdomen and left subclavian area were shaved.  We prepped his left chest  and neck with Betadine and draped in sterile fashion.  A time-out was  taken to assure the proper patient and proper procedure.  The patient  was positioned in Trendelenburg.  His left  subclavian vein was then  cannulated with an 18-gauge needle.  We advanced a guidewire and  fluoroscopy confirmed the wire heading down the superior vena cava.  We  then created a subcutaneous pocket inferior to the insertion site.  I  made a subcutaneous tunnel from the pouch to the insertion site.  We  used an 8-French PowerPort catheter.  This was tunneled from the pouch  to the insertion site.  We secured the port in place with two  interrupted 2-0 Prolene sutures.  I also removed some of the  subcutaneous fat anterior to the port.  The dilator and breakaway sheath  were then passed over the wire into the clavipectoral fascia.  This was  advanced into the subclavian vein.  The dilator was removed and the  catheter was inserted.  The breakaway sheath was then removed as the  catheter was advanced.  Fluoroscopy confirmed good placement with the  tip at the cavoatrial junction (we had cut the catheter at 25 cm).  The  catheter was then aspirated with good blood return and then flushed with  heparinized saline.  We  closed both wounds with 3-0 Vicryl and 4-0  Monocryl.  Steri-Strips and clean dressings were applied.  We instilled  5 mL of concentrated heparin solution into the catheter.  An occlusive  dressing was then applied.   We then took down all the drapes.  The patient's abdomen was prepped and  draped in sterile fashion.  We rescrubbed and put on sterile gown and  gloves.  I made a 1-cm vertical incision above his umbilicus.  Dissection was carried down to the fascia, which was opened vertically.  We entered the peritoneal cavity bluntly.  A stay suture of 0 Vicryl was  placed around the fascial opening.  The Hasson cannula was inserted and  secured with a stay suture.  Pneumoperitoneum was obtained by  insufflating CO2 and maintaining a maximal pressure of 15 mmHg.  The  patient was positioned in Trendelenburg, tilted to his left.  The liver  appeared grossly normal.  A 5-mm port  was placed in the right upper  quadrant.  Another 5-mm port was placed in the left lower quadrant.  We  switched to a 5-mm, 30-degree laparoscope through the right upper  quadrant port site.  We then mobilized the cecum medially.  We  identified the appendix.  The tip of appendix did appear mildly  thickened but there was no gross inflammation or purulence.  The tip of  the appendix was densely adherent down into the pelvis, into the lateral  side of the pelvis.  I began by creating a small window in the  mesoappendix at the base.  The base of the appendix was then divided  with an Endo-GIA stapler.  We then used a Harmonic Scalpel to mobilize  the remainder of the appendix distally.  Once the appendix was  completely free, we placed it in an EndoCatch sac and removed it through  the umbilical port site.  We irrigated the right lower quadrant and no  bleeding was noted.  We then turned our attention to the descending  sigmoid colon.  We placed an additional 5-mm port in the right lower  quadrant.  The patient was positioned tilted to his right.  We divided  the lateral attachments of the descending colon and sigmoid to mobilize  it.  Once we had adequate mobilization, we selected a fairly mobile area  in the distal descending colon and brought it up to the anterior  abdominal wall.  We selected a site for our diverting loop colostomy.  I  excised an ellipse of skin and subcutaneous fat.  The fascia was opened  and the muscle fibers were split.  The inferior epigastrics were taken  with the Harmonic Scalpel.  We entered the peritoneal cavity.  The  selected loop of colon was brought up into the wound.  We created a  small window behind the loop and passed a 16-French red rubber catheter.  We then removed the remainder of our laparoscopic ports.  We closed the  umbilical fascia with a pursestring suture.  Staples were used to close  the skin.  We then matured the colostomy by opening with a  cautery.  The  red rubber catheter was secured to the skin with 2-0 nylon suture.  Vicryl 3-0 was used to mature the loop colostomy circumferentially.  I  passed a finger into each limb of the loop colostomy and they were  widely patent.  An ostomy appliance was cut to fit.  The patient was  then extubated and brought  to recovery in stable condition.  All sponge,  instrument and needle counts were correct.      Wilmon Arms. Tsuei, M.D.  Electronically Signed     MKT/MEDQ  D:  12/02/2007  T:  12/02/2007  Job:  [id number]

## 2010-10-06 NOTE — H&P (Signed)
NAMEBO, TEICHER NO.:  [id number]   MEDICAL RECORD NO.:  [id number]          PATIENT TYPE:  OIB   LOCATION:  0006                         FACILITY:  [facility]   PHYSICIAN:  Jordan Hawks. Elnoria Howard, MD    DATE OF BIRTH:  1971/04/05   DATE OF ADMISSION:  12/01/2007  DATE OF DISCHARGE:                              HISTORY & PHYSICAL   PRIMARY CARE PHYSICIAN:  Sharlot Gowda, M.D.   NEUROSURGEON:  Dr. Noel Gerold.   SURGEON:  Wilmon Arms. Tsuei, M.D.   HISTORY OF PRESENT ILLNESS:  This is a 40 year old gentleman who is  referred by Dr. Susann Givens for a rectal mass that was palpated on his  rectal examination.  The patient did complain of having hematochezia  approximately 1 year ago, however, this was of a limited duration.  It  was acute in onset and subsequently rapidly resolved within a couple of  days.  Subsequently, he did not have any further bleeding.  The patient  presented earlier this year with complaints of back pain and he was  noted to have a T12 fracture of unknown cause.  He was evaluated by Dr.  Noel Gerold at that time and he has been treated with medical management.  Unfortunately, he also complained of having severe rectal and back pain.  With the use of NSAIDs, he did have hematochezia.  He was again  evaluated by Dr. Susann Givens, however, a limited rectal examination was  negative for any types of overt evidence of bleeding.  Once he stopped  the NSAIDs, there was no further evidence of bleeding.  Because of his  persistent symptoms, he represented back to Dr. Susann Givens last week and a  repeat rectal examination was performed.  He was noted to have a rectal  mass.  Subsequently, he was referred to Dr. Elnoria Howard for further evaluation  and treatment.  Upon evaluation by Dr. Elnoria Howard in the office, there was a  palpable mass and he was extremely tender with the rectal exam.  Subsequently, the plans were made for him to undergo a rectal endoscopic  ultrasound as well as CT scan.  The CT  scan was performed on November 30, 2007 and it revealed that he had a large 6 cm x 6 cm rectal mass with  associated lymphadenopathy.  There was also some lymphadenopathy in the  portacaval region, but no liver lesions were identified.  The rectal  endoscopic ultrasound did reveal an obstructive rectal mass that was  very friable.  Biopsies were obtained.  On endoscopic ultrasound, he was  noted to have a 1.8 cm lymph node in the sigmoid colon that correlated  with the patient's CT scan.  Additionally, there were also some  peritumoral lymph nodes that were also identified.  Upon EUS staging,  the patient is classified as having a T3 lesion at this time.  Because  of the obstructive nature of his mass, it was felt that he required  admission at this time for a possible diversion colostomy.  Additionally, the patient's CT scan also revealed an abnormal appendix.  There was no  overt evidence of appendicitis, but approximately 5-6 days  prior to admission, the patient did complain of right lower quadrant  pain.  Subsequently, he has had low-grade fevers as well as an elevated  white count noted to be in the 20,000 range in Dr. Jola Babinski office.  Because of these findings, he was started on oral antibiotics on November 30, 2007 with ciprofloxacin and Flagyl.  On the day of the endoscopic  ultrasound, the patient did note a significant improvement with the use  of the antibiotic.  Unfortunately, his rectal pain has not been able to  be controlled with MS Contin.   PAST MEDICAL/SURGICAL HISTORY:  As stated above.   FAMILY HISTORY:  Noncontributory.  No evidence of any cancers.   SOCIAL HISTORY:  Negative for alcohol, tobacco or illicit drug use.   REVIEW OF SYSTEMS:  As stated above in history of present illness with  the addition of a 24 pound weight loss, otherwise negative.   MEDICATIONS:  1. Ciprofloxacin 500 mg 1 p.o. b.i.d.  2. Metronidazole 250 mg 1 p.o. t.i.d.   PHYSICAL EXAMINATION:   VITAL SIGNS:  Stable.  GENERAL:  The patient is in no acute distress, but he is uncomfortable.  HEENT:  Normocephalic, atraumatic.  Extraocular muscles are intact.  NECK:  Supple.  No lymphadenopathy.  LUNGS:  Clear to auscultation bilaterally.  CARDIOVASCULAR:  Regular rate and rhythm.  ABDOMEN:  Flat, soft, nontender and nondistended.  Positive bowel  sounds.  EXTREMITIES:  No clubbing, cyanosis, or edema.   LABORATORY DATA:  Laboratory values are pending at this time.   IMPRESSION:  1. Obstructing rectal mass.  2. Questionable appendicitis.  3. As stated above in the history of present illness, the patient is      noted to have a T3 rectal cancer.  Because of the obstructing      nature of the lesion, I feel that he needs an admission to the      hospital with a surgical consultation.  I am uncertain if a      diverting colostomy is required at this time, however, I will defer      that decision to surgery.  Certainly he will require med/onc and      most radiation/oncology to evaluate the patient for possible      neoadjuvant therapy.  4. He was also noted to have a polyp in the sigmoid colon, but given      the current findings, I doubt that he would be able to adequately      prep for a colonoscopy to clear out the proximal colon.   PLAN:  1. Admit the patient.  2. Surgical consultation.  3. Oncology consultation.      Jordan Hawks Elnoria Howard, MD  Electronically Signed     PDH/MEDQ  D:  12/01/2007  T:  12/01/2007  Job:  [id number]   cc:   Sharlot Gowda, M.D.  Fax: [phone]   Wilmon Arms. Corliss Skains, M.D.  [address] [place] [address] [place] [address]  Missy Sabins, MD

## 2010-10-06 NOTE — Consult Note (Signed)
NAMEBRYN, Stephen Tanner NO.:  [id number]   MEDICAL RECORD NO.:  [id number]          PATIENT TYPE:  INP   LOCATION:  1321                         FACILITY:  [facility]   PHYSICIAN:  Rose Phi. Myna Hidalgo, M.D. DATE OF BIRTH:  13-Sep-1970   DATE OF CONSULTATION:  12/01/2007  DATE OF DISCHARGE:                                 CONSULTATION   REFERRING PHYSICIAN:  Jordan Hawks. Elnoria Howard, MD.   REASON FOR CONSULTATION:  Likely stage III adenocarcinoma of the rectum.   HISTORY OF PRESENT ILLNESS:  Stephen Tanner is a very nice 40 year old  gentleman who has had no past medical history at all.  He has been very  healthy.  He is followed by Dr. Sharlot Gowda for medical care.   He noted some intermittent hematochezia about a year ago.  This seemed  to resolve on its own.  He had no problems with rectal pain.  He had not  noted any change in bowel or bladder habits.   He was noted to have some back discomfort and was noted to have a T12  fracture.  He was seen by Dr. Sharolyn Douglas and treated medically.   He then began to complain more of rectal and back pain.  He also was  noted to have some more hematochezia.   He was seen by Dr. Susann Givens.  He subsequently was found to have a rectal  mass on physical exam.   Dr. Susann Givens referred Stephen Tanner to Dr. Elnoria Howard.  Dr. Elnoria Howard saw Stephen Tanner.  He also noted a palpable mass in the rectum.   He did undergo a CT of the abdomen and pelvis.  This was done on November 30, 2007.  This showed some primary retroperitoneal and porta hepatis  nodes.  No obvious metastatic disease noted in the abdomen.  He had a  5.7 x 6.1 cm anorectal mass involving the perirectal space and levator  musculature.  There was some enlarged lymph nodes in the mesorectal fat.  The metastatic adenopathy appeared to track along the sigmoid mesocolon  with a 2.3 x 2 cm node appreciated.  There was no obvious obstruction.   Dr. Elnoria Howard did an endoscopic ultrasound.  This was done on December 01, 2007.  Stephen Tanner had a 2-3 lesion.  Biopsies were taken and the above report  [facility] [phone]) showed adenocarcinoma.   Dr. Manus Rudd was consulted.  Dr. Corliss Skains felt that because of the  near obstructing nature of the mass, a diverting colostomy would be  needed.  As such, Stephen Tanner was taken to surgery on December 02, 2007.  A  laparoscopic procedure was done.  A diverting sigmoid colostomy was  performed.  A PowerPort was also placed at that time.   A CA level has been sent off and is 0.8.   Stephen Tanner had a PET scan done on December 05, 2007.  The PET scan showed  the large rectal mass which was intensely hypermetabolic.  There was  some borderline enlarged pelvic nodes exhibiting increased uptake,  concerning for metastatic disease.  There is  no evidence of disease  elsewhere.  There is some splenomegaly.   We were asked to see Stephen Tanner so we could help coordinate care.   He has had some weight loss.  He has tried to lose a little bit of  weight he says.  He has been working.  He is a Curator.  He has had  __________ because of the pain.  He has no cough or shortness of breath.  He has not noted any problems with fevers, sweats or chills.  He has not  had any recurrent hematochezia.   PAST MEDICAL HISTORY:  Pretty much unrevealing.   ALLERGIES:  1. SULFA.  2. PENICILLIN.  3. NONSTEROIDALS/CELEBREX.   ADMISSION MEDICATIONS:  1. Cipro 500 mg p.o. b.i.d.  2. Flagyl 250 mg p.o. t.i.d.   SOCIAL HISTORY:  Unremarkable for any significant alcohol or tobacco  use.   FAMILY HISTORY:  Negative for any history of colon cancer or malignancy  in the family.   REVIEW OF SYSTEMS:  As stated in history of present illness.  No  additional findings were noted on a 12-system review.   PHYSICAL EXAMINATION:  GENERAL:  This is a well-developed, well-  nourished white gentleman in no obvious distress.  VITAL SIGNS:  Temperature 99.1, pulse 103, respiratory rate 18, blood  pressure  120/86.  HEAD/NECK:  Shows normocephalic, atraumatic skull.  There are no ocular  or oral lesions.  He has palpable cervical and supraclavicular lymph  nodes.  LUNGS:  Clear bilaterally.  CARDIAC:  Regular rate and rhythm with normal S1-S2.  No murmurs, rubs  or bruits.  ABDOMEN:  Shows a colostomy in the left quadrant of the abdomen.  He has  healing laparoscopy scars.  There is no fluid wave.  There is some  slight tenderness in the lower abdomen.  There is no palpable  hepatosplenomegaly.  BACK:  No tenderness of the spine, ribs or hips.  EXTREMITIES:  Shows no clubbing, cyanosis or edema.   LABORATORY DATA:  White blood cell count of 13, hemoglobin 14,  hematocrit 40 and platelet count 336.  His sodium was 137, potassium  4.1, BUN 12, creatinine 0.8.  Calcium 9.   IMPRESSION:  Stephen Tanner is a 1 year old gentleman with stage III (at  least) adenocarcinoma of the rectum.  He is status post diverting  colostomy because of near obstructing nature of the mass.   He clearly is a candidate for neoadjuvant therapy.  He is very healthy.  He has an excellent performance status (ECOG stage 0).   We will certainly use 5-FU-based therapy.  This may be either Xeloda or  a 5-FU pump.  We will have to see about his medical insurance and how  much the Xeloda would cost him.   I think Avastin also would be a good idea to use.  Unfortunately, he  just had surgery, so we cannot use Avastin for at least 4 weeks.   I think oxaliplatin also would be incorporated into the program.   Dr. Welton Flakes of radiation/oncology has seen him.  Would like to coordinate  his care.   Local control is going to be essential in my opinion.  I think that  local control is going to help with pain control down the road and other  issues that would present themselves with local recurrence.   As far as pain control is concerned, Stephen Tanner will need some long-  acting pain medication.  I think he was on MS  Contin at  home.  We will  have to see about getting him back on MS Contin, maybe at a lower dose,  upon discharge.   I am glad to see that Dr. Corliss Skains was able to put the Port-A-Cath in, so  we have access to that.   We will begin to coordinate care as an outpatient.   He more than likely will need postop adjuvant chemotherapy once he gets  through his surgery, which probably will not be for another 3 months or  so.   I spoke to Mr. Citro and his wife at length.  They are both very nice.  Each of them have a good understanding of the situation.   We will follow him along and then plan for management as an outpatient  once he is discharged.      Rose Phi. Myna Hidalgo, M.D.  Electronically Signed     PRE/MEDQ  D:  12/06/2007  T:  12/06/2007  Job:  [id number]   cc:   Jordan Hawks. Elnoria Howard, MD  Fax: [phone]   Wilmon Arms. Corliss Skains, M.D.  [address] [place] [address] [place] [address]  [facility]   Sharolyn Douglas, M.D.  Fax: [phone]   Sharlot Gowda, M.D.  Fax: [phone]   Billie Lade, M.D.  Fax: [phone]

## 2010-10-06 NOTE — Consult Note (Signed)
Stephen Tanner, CANIZALEZ NO.:  [id number]   MEDICAL RECORD NO.:  [id number]          PATIENT TYPE:  OIB   LOCATION:  1321                         FACILITY:  [facility]   PHYSICIAN:  Wilmon Arms. Corliss Skains, M.D. DATE OF BIRTH:  Jan 14, 1971   DATE OF CONSULTATION:  DATE OF DISCHARGE:                                 CONSULTATION   REASON FOR CONSULTATION:  Obstructing rectal mass.   HISTORY OF PRESENT ILLNESS:  The patient is a 40 year old male who  presents with a palpable rectal mass.  His primary care physician is Dr.  Susann Givens.  The patient states that about a year ago he had a bowel  movement in which he passed a large amount of gross red blood.  This was  fairly limited in duration.  He states that over the last several months  he has had some problems with back and neck pain.  He does have  herniated disks in his neck.  He was also found to have a T12  compression fracture of unknown etiology.  This was asymptomatic.  For  the last 2 months the patient has had severe lower back and rectal pain.  He was placed on nonsteroidal anti-inflammatory medications, which  caused a recurrence of his GI bleeding.  He also has continued to have  blood streaking on his bowel movements.  The patient has also had a lot  of intermittent fevers and night sweats.  He has had very limited energy  and has been losing weight without trying.  Last week he presented back  to Dr. Susann Givens for examination and rectal examination showed a large  rectal mass.  He was then referred to Dr. Elnoria Howard for further evaluation.  A CT scan performed yesterday showed a 6 x 6 cm rectal mass with  associated lymphadenopathy.  There was some portacaval lymphadenopathy  but no sign of liver metastases.  A rectal endoscopic ultrasound was  performed which showed the large rectal mass.  He was able to pass the  probe past the lesion.  Biopsies are pending.  He had a 1.8-cm lymph  node in the sigmoid colon mesentery.  The  lesion seems to be a T3  lesion.  The patient's pain is now being managed with a Dilaudid PCA.  Of note, the patient CT scan also showed an abnormal-appearing appendix.  There is minimal surrounding edema.  This was of unclear etiology.  The  patient has no right lower quadrant pain.   PAST MEDICAL HISTORY:  Unremarkable.   PAST SURGICAL HISTORY:  Ear surgery as a child.   ALLERGIES:  PENICILLIN, SULFA, NSAIDs.   CURRENT MEDICATIONS:  Ciprofloxacin and Flagyl.   FAMILY HISTORY:  Noncontributory.  No history of any colon cancers.   SOCIAL HISTORY:  Nonsmoker, nondrinker.   PHYSICAL EXAMINATION:  VITAL SIGNS:  Temperature 98, pulse 101,  respirations 20, blood pressure 114/75, sats 97% on room air.  This is a well-developed, well-nourished male in no apparent distress.  HEENT:  EOMI.  Sclerae anicteric.  NECK:  No mass.  No thyromegaly.  LUNGS:  Clear  to auscultation bilaterally.  Normal respiratory effort.  HEART:  Regular rate and rhythm.  No murmur.  ABDOMEN:  Positive bowel sounds, slightly distended, minimal tenderness.  No rebound, no guarding.  RECTAL:  Examination is deferred due to his  earlier procedure.  EXTREMITIES:  No edema.  SKIN:  Warm, dry with no sign of jaundice.   IMPRESSION:  Obstructing rectal cancer likely locally advanced with  possible lymph node metastases.   RECOMMENDATIONS:  Would recommend a diversion with a loop descending  colostomy to divert the fecal stream.  We will also look at the appendix  and possibly perform a laparoscopic appendectomy.  Would then recommend  neoadjuvant radiation chemotherapy in hopes of downstaging the tumor.  The patient understands that he still might end up with an abdominal  perineal resection and permanent colostomy.  However, he wants to  aggressively treat the tumor with preoperative chemoradiation.  Pathology is pending and an oncology consult is pending.  We will plan  on his surgery tomorrow as the patient had  lunch today.      Wilmon Arms. Tsuei, M.D.  Electronically Signed     MKT/MEDQ  D:  12/01/2007  T:  12/01/2007  Job:  [id number]   cc:   Jordan Hawks. Elnoria Howard, MD  Fax: [phone]   Sharlot Gowda, M.D.  Fax: [phone]

## 2010-10-22 ENCOUNTER — Ambulatory Visit: Payer: BC Managed Care – PPO | Admitting: Internal Medicine

## 2010-10-29 ENCOUNTER — Telehealth: Payer: Self-pay | Admitting: Internal Medicine

## 2010-10-29 NOTE — Telephone Encounter (Signed)
Pt is taking verapamil 180 mg for migraines. Pt would like to increase mg.target [phone]

## 2010-11-02 ENCOUNTER — Ambulatory Visit: Payer: BC Managed Care – PPO | Admitting: Internal Medicine

## 2010-11-08 NOTE — Telephone Encounter (Signed)
i thought we did this but don't see documentation. Ok to increase to 240mg  but will need to monitor bp

## 2010-11-09 MED ORDER — VERAPAMIL HCL ER 240 MG PO TBCR: 240.0000 mg | EXTENDED_RELEASE_TABLET | Freq: Every day | ORAL | Status: DC

## 2010-11-09 NOTE — Telephone Encounter (Signed)
Left message on pt's voicemail, per wife Trish's request, to notify rx mg increased and sent to pharm

## 2010-12-21 ENCOUNTER — Telehealth: Payer: Self-pay | Admitting: Family Medicine

## 2010-12-21 MED ORDER — TOPIRAMATE 50 MG PO TABS: 50.0000 mg | ORAL_TABLET | Freq: Two times a day (BID) | ORAL | Status: DC

## 2010-12-21 NOTE — Telephone Encounter (Signed)
His wife is seeing me today, and she related that he has been having daily migraines for about a month despite taking Verapamil. We will start him on Topamax daily for prophylaxis. See me in one month

## 2010-12-22 ENCOUNTER — Ambulatory Visit (INDEPENDENT_AMBULATORY_CARE_PROVIDER_SITE_OTHER): Payer: BC Managed Care – PPO | Admitting: Family Medicine

## 2010-12-22 ENCOUNTER — Encounter: Payer: Self-pay | Admitting: Family Medicine

## 2010-12-22 VITALS — BP 120/82 | HR 94 | Temp 98.5°F | Wt 220.0 lb

## 2010-12-22 DIAGNOSIS — G44019 Episodic cluster headache, not intractable: Secondary | ICD-10-CM

## 2010-12-22 DIAGNOSIS — G43909 Migraine, unspecified, not intractable, without status migrainosus: Secondary | ICD-10-CM

## 2010-12-22 MED ORDER — EPINEPHRINE 0.3 MG/0.3ML IJ DEVI: 0.3000 mg | Freq: Once | INTRAMUSCULAR | Status: DC

## 2010-12-22 MED ORDER — VERAPAMIL HCL 180 MG PO TBCR: 180.0000 mg | EXTENDED_RELEASE_TABLET | Freq: Three times a day (TID) | ORAL | Status: DC

## 2010-12-22 MED ORDER — TOPIRAMATE 50 MG PO TABS: 50.0000 mg | ORAL_TABLET | Freq: Every day | ORAL | Status: DC

## 2010-12-22 NOTE — Progress Notes (Signed)
  Subjective:    Patient ID: Stephen Tanner, male    DOB: [birth year], 40 y.o.   MRN: [id number]  HPI Here to discuss HAs and meds. He has a long hx of both migraines and cluster HAs. Usually OTC meds like Excedrin work well for the migraines but he uses Imitrex nasal spray for the cluster HAs. These are more intense, come on more quickly, and resolve more quickly (usually within 30 minutes). He had good luck for several years preventing the HAs with Verapamil, especially when he went to tid dosing at 180 mg tid. He was switched to bid dosing with 240 mg earlier this year, and now the HAs are more frequent. He may go aw week with no HA, but then he may have a HA every day for a week. He started on Topamax at 50 mg bid yesterday, and he took pone dose last night and one this morning. Shortly after this morning's dose he got a very intense cluster HA. When he tried the Imitrex NS he suddenly felt weak, lightheaded, and he was numb in the hands and feet. This resolved after several hours. Now he feels fine.    Review of Systems  Constitutional: Negative.   Respiratory: Negative.   Cardiovascular: Negative.   Neurological: Positive for light-headedness, numbness and headaches. Negative for dizziness, tremors, seizures, syncope, speech difficulty and weakness.       Objective:   Physical Exam  Constitutional: He is oriented to person, place, and time. He appears well-developed and well-nourished.  Cardiovascular: Normal rate, regular rhythm, normal heart sounds and intact distal pulses.   Pulmonary/Chest: Effort normal and breath sounds normal.  Neurological: He is alert and oriented to person, place, and time. No cranial nerve deficit. He exhibits normal muscle tone. Coordination normal.          Assessment & Plan:   I think the weakness and numbness he felt this morning was due to taking Imitrex while he is still getting used to the Topamax. We will decrease the Topamax to dosing only once a  day at bedtime. Switch the Verapamil back to 180 mg tid to prevent any rebound or trough effect. Recheck in one month

## 2010-12-26 ENCOUNTER — Emergency Department (HOSPITAL_COMMUNITY): Payer: BC Managed Care – PPO

## 2010-12-26 ENCOUNTER — Emergency Department (HOSPITAL_COMMUNITY)
Admission: EM | Admit: 2010-12-26 | Discharge: 2010-12-27 | Disposition: A | Discharge status: Home / Self Care | Payer: BC Managed Care – PPO | Attending: Emergency Medicine | Admitting: Emergency Medicine

## 2010-12-26 DIAGNOSIS — I499 Cardiac arrhythmia, unspecified: Secondary | ICD-10-CM | POA: Insufficient documentation

## 2010-12-26 DIAGNOSIS — R0602 Shortness of breath: Secondary | ICD-10-CM | POA: Insufficient documentation

## 2010-12-26 DIAGNOSIS — R609 Edema, unspecified: Secondary | ICD-10-CM | POA: Insufficient documentation

## 2010-12-26 DIAGNOSIS — R51 Headache: Secondary | ICD-10-CM | POA: Insufficient documentation

## 2010-12-26 DIAGNOSIS — R0989 Other specified symptoms and signs involving the circulatory and respiratory systems: Secondary | ICD-10-CM | POA: Insufficient documentation

## 2010-12-26 DIAGNOSIS — M79609 Pain in unspecified limb: Secondary | ICD-10-CM | POA: Insufficient documentation

## 2010-12-26 DIAGNOSIS — R0609 Other forms of dyspnea: Secondary | ICD-10-CM | POA: Insufficient documentation

## 2010-12-26 LAB — DIFFERENTIAL
Basophils Absolute: 0 10*3/uL (ref 0.0–0.1)
Eosinophils Relative: 1 % (ref 0–5)
Lymphocytes Relative: 37 % (ref 12–46)
Lymphs Abs: 2.3 10*3/uL (ref 0.7–4.0)
Monocytes Absolute: 0.5 10*3/uL (ref 0.1–1.0)

## 2010-12-26 LAB — CBC
HCT: 42.7 % (ref 39.0–52.0)
MCHC: 35.8 g/dL (ref 30.0–36.0)
MCV: 89 fL (ref 78.0–100.0)
RDW: 12.6 % (ref 11.5–15.5)

## 2010-12-26 LAB — BASIC METABOLIC PANEL
BUN: 30 mg/dL — ABNORMAL HIGH (ref 6–23)
Calcium: 9.3 mg/dL (ref 8.4–10.5)
GFR calc non Af Amer: >60 mL/min (ref >60)
Glucose, Bld: 102 mg/dL — ABNORMAL HIGH (ref 70–99)

## 2010-12-26 MED ORDER — IOHEXOL 300 MG/ML  SOLN
100.0000 mL | Freq: Once | INTRAMUSCULAR | Status: AC | PRN
  Administered 2010-12-26: 100 mL via INTRAVENOUS

## 2010-12-27 ENCOUNTER — Ambulatory Visit (HOSPITAL_COMMUNITY)
Admission: RE | Admit: 2010-12-27 | Discharge: 2010-12-27 | Disposition: A | Discharge status: Home / Self Care | Payer: BC Managed Care – PPO | Source: Intra-hospital | Attending: Emergency Medicine | Admitting: Emergency Medicine

## 2010-12-27 DIAGNOSIS — M7989 Other specified soft tissue disorders: Secondary | ICD-10-CM

## 2010-12-28 ENCOUNTER — Telehealth: Payer: Self-pay | Admitting: Pediatrics

## 2010-12-28 DIAGNOSIS — G44009 Cluster headache syndrome, unspecified, not intractable: Secondary | ICD-10-CM

## 2010-12-28 NOTE — Telephone Encounter (Signed)
Pts spouse called and said that pt has been having migraine clusters and has found that oxygen therapy helps. Pt is req to get a referral for an in home sleep study via [facility] for sleep apnea and study by Dr Dohmeier with [facility].

## 2010-12-29 NOTE — Telephone Encounter (Signed)
Please advise 

## 2010-12-30 NOTE — Telephone Encounter (Signed)
I do not order sleep studies myself, but Dr. Vickey Huger would be a good choice to see to set all this up. I will order a referral to see her for the HAs and for possible sleep apnea. Tell the patient to expect a call soon about this appointment

## 2010-12-30 NOTE — Telephone Encounter (Signed)
Pt aware.

## 2011-02-18 LAB — CBC
HCT: 39.1
Hemoglobin: 13.3
Hemoglobin: 13.9
MCHC: 34.1
RBC: 4.51
RDW: 12.1

## 2011-02-18 LAB — BASIC METABOLIC PANEL
CO2: 29
Calcium: 9
GFR calc Af Amer: >60
GFR calc non Af Amer: >60
GFR calc non Af Amer: >60
Glucose, Bld: 149 — ABNORMAL HIGH
Potassium: 4
Sodium: 135
Sodium: 137

## 2011-02-19 LAB — CBC
Hemoglobin: 14.5
MCHC: 34.7
MCHC: 34.9
MCV: 89.3
Platelets: 425 — ABNORMAL HIGH
RBC: 4.55
RDW: 11.9
RDW: 12.1

## 2011-02-19 LAB — BASIC METABOLIC PANEL
CO2: 30
Calcium: 9.2
Chloride: 101
Glucose, Bld: 124 — ABNORMAL HIGH
Sodium: 137

## 2011-02-23 LAB — GLUCOSE, CAPILLARY: Glucose-Capillary: 95

## 2011-03-29 ENCOUNTER — Telehealth: Payer: Self-pay | Admitting: Family Medicine

## 2011-03-29 MED ORDER — AZITHROMYCIN 250 MG PO TABS: ORAL_TABLET | ORAL | Status: AC

## 2011-03-29 NOTE — Telephone Encounter (Signed)
His wife is here with his son, and she says that Stephen Tanner has had a week of sinus pressure, PND, ST, and a dry cough. No fever. On fluids and Mucinex. I printed out a rx for a Zpack and gave this to his wife today

## 2012-10-05 ENCOUNTER — Encounter: Payer: Self-pay | Admitting: Family Medicine

## 2012-11-17 ENCOUNTER — Other Ambulatory Visit (INDEPENDENT_AMBULATORY_CARE_PROVIDER_SITE_OTHER): Payer: 59

## 2012-11-17 ENCOUNTER — Encounter: Payer: Self-pay | Admitting: Family Medicine

## 2012-11-17 DIAGNOSIS — Z0000 Encounter for general adult medical examination without abnormal findings: Secondary | ICD-10-CM

## 2012-11-17 LAB — BASIC METABOLIC PANEL
Calcium: 9.2 mg/dL (ref 8.4–10.5)
GFR: 95.95 mL/min (ref >60.00)
Potassium: 3.8 mEq/L (ref 3.5–5.1)
Sodium: 137 mEq/L (ref 135–145)

## 2012-11-17 LAB — LDL CHOLESTEROL, DIRECT: Direct LDL: 199.8 mg/dL

## 2012-11-17 LAB — CBC WITH DIFFERENTIAL/PLATELET
Basophils Absolute: 0 10*3/uL (ref 0.0–0.1)
Eosinophils Relative: 2.3 % (ref 0.0–5.0)
HCT: 48 % (ref 39.0–52.0)
Hemoglobin: 16.7 g/dL (ref 13.0–17.0)
Lymphocytes Relative: 41 % (ref 12.0–46.0)
Lymphs Abs: 2.1 10*3/uL (ref 0.7–4.0)
Monocytes Relative: 8.8 % (ref 3.0–12.0)
Neutro Abs: 2.4 10*3/uL (ref 1.4–7.7)
RDW: 12.8 % (ref 11.5–14.6)
WBC: 5.1 10*3/uL (ref 4.5–10.5)

## 2012-11-17 LAB — POCT URINALYSIS DIPSTICK
Bilirubin, UA: NEGATIVE
Blood, UA: NEGATIVE
Glucose, UA: NEGATIVE
Spec Grav, UA: 1.02
pH, UA: 7

## 2012-11-17 LAB — HEPATIC FUNCTION PANEL
ALT: 70 U/L — ABNORMAL HIGH (ref 0–53)
AST: 34 U/L (ref 0–37)
Albumin: 4.1 g/dL (ref 3.5–5.2)
Alkaline Phosphatase: 56 U/L (ref 39–117)
Total Bilirubin: 0.6 mg/dL (ref 0.3–1.2)

## 2012-11-17 LAB — LIPID PANEL
HDL: 68.4 mg/dL (ref >39.00)
Total CHOL/HDL Ratio: 4
Triglycerides: 156 mg/dL — ABNORMAL HIGH (ref 0.0–149.0)

## 2012-11-17 LAB — TSH: TSH: 0.71 u[IU]/mL (ref 0.35–5.50)

## 2012-11-17 NOTE — Progress Notes (Signed)
Quick Note:  Pt has CPE on 11/23/12 will go over then. ______

## 2012-11-23 ENCOUNTER — Encounter: Payer: Self-pay | Admitting: Family Medicine

## 2012-11-23 ENCOUNTER — Ambulatory Visit (INDEPENDENT_AMBULATORY_CARE_PROVIDER_SITE_OTHER): Payer: 59 | Admitting: Family Medicine

## 2012-11-23 VITALS — BP 140/90 | HR 80 | Temp 98.4°F | Ht 72.0 in | Wt 226.0 lb

## 2012-11-23 DIAGNOSIS — Z0000 Encounter for general adult medical examination without abnormal findings: Secondary | ICD-10-CM

## 2012-11-23 DIAGNOSIS — D649 Anemia, unspecified: Secondary | ICD-10-CM

## 2012-11-23 DIAGNOSIS — C20 Malignant neoplasm of rectum: Secondary | ICD-10-CM

## 2012-11-23 DIAGNOSIS — E785 Hyperlipidemia, unspecified: Secondary | ICD-10-CM

## 2012-11-23 DIAGNOSIS — R739 Hyperglycemia, unspecified: Secondary | ICD-10-CM

## 2012-11-23 DIAGNOSIS — R7309 Other abnormal glucose: Secondary | ICD-10-CM

## 2012-11-23 MED ORDER — ATORVASTATIN CALCIUM 20 MG PO TABS: 20.0000 mg | ORAL_TABLET | Freq: Every day | ORAL | Status: DC

## 2012-11-23 NOTE — Progress Notes (Signed)
  Subjective:    Patient ID: Stephen Tanner, male    DOB: December 17, 1970, 42 y.o.   MRN: [id number]  HPI 42 yr old male for a cpx. He feels well in general. He has not seen Oncology for about 3 years. He purchases his own ostomy supplies OTC.  He quit smoking 7 months ago.    Review of Systems  Constitutional: Negative.   HENT: Negative.   Eyes: Negative.   Respiratory: Negative.   Cardiovascular: Negative.   Gastrointestinal: Negative.   Genitourinary: Negative.   Musculoskeletal: Negative.   Skin: Negative.   Neurological: Negative.   Psychiatric/Behavioral: Negative.        Objective:   Physical Exam  Constitutional: He is oriented to person, place, and time. He appears well-developed and well-nourished. No distress.  HENT:  Head: Normocephalic and atraumatic.  Right Ear: External ear normal.  Left Ear: External ear normal.  Nose: Nose normal.  Mouth/Throat: Oropharynx is clear and moist. No oropharyngeal exudate.  Eyes: Conjunctivae and EOM are normal. Pupils are equal, round, and reactive to light. Right eye exhibits no discharge. Left eye exhibits no discharge. No scleral icterus.  Neck: Neck supple. No JVD present. No tracheal deviation present. No thyromegaly present.  Cardiovascular: Normal rate, regular rhythm, normal heart sounds and intact distal pulses.  Exam reveals no gallop and no friction rub.   No murmur heard. Pulmonary/Chest: Effort normal and breath sounds normal. No respiratory distress. He has no wheezes. He has no rales. He exhibits no tenderness.  Abdominal: Soft. Bowel sounds are normal. He exhibits no distension and no mass. There is no tenderness. There is no rebound and no guarding.  Genitourinary: Penis normal. No penile tenderness.  Musculoskeletal: Normal range of motion. He exhibits no edema and no tenderness.  Lymphadenopathy:    He has no cervical adenopathy.  Neurological: He is alert and oriented to person, place, and time. He has normal  reflexes. No cranial nerve deficit. He exhibits normal muscle tone. Coordination normal.  Skin: Skin is warm and dry. No rash noted. He is not diaphoretic. No erythema. No pallor.  Psychiatric: He has a normal mood and affect. His behavior is normal. Judgment and thought content normal.          Assessment & Plan:  Well exam. He will start on Lipitor for the high cholesterol, and we will recheck lipids in 90 days. His glucose is elevated so we will check an A1c in 90 days. He has a hx of elevated serum iron levels so at his next lab draw we will check iron and ferritin. Encouraged him to lose weight.

## 2012-11-27 ENCOUNTER — Telehealth: Payer: Self-pay | Admitting: Hematology & Oncology

## 2012-11-27 NOTE — Telephone Encounter (Signed)
Received referral from Dr. Clent Ridges. Pt aware of 7-31 appointment

## 2012-12-19 ENCOUNTER — Telehealth: Payer: Self-pay | Admitting: Hematology & Oncology

## 2012-12-19 NOTE — Telephone Encounter (Signed)
Patient called and cx 12/21/12 apt.  He stated, he will call back to resch

## 2012-12-21 ENCOUNTER — Other Ambulatory Visit: Payer: 59 | Admitting: Lab

## 2012-12-21 ENCOUNTER — Ambulatory Visit: Payer: 59 | Admitting: Hematology & Oncology

## 2012-12-26 ENCOUNTER — Telehealth: Payer: Self-pay | Admitting: Family Medicine

## 2012-12-26 NOTE — Telephone Encounter (Signed)
Pt wife stopped by office . Pt is too busy to come, but the atorvastatin (LIPITOR) 20 MG tablet md put him on made him sick, nauseous, achey, pale. Pt has stopped taking. Would like to try something else., less side effects.  30 day only  Pharm: [facility]

## 2012-12-26 NOTE — Telephone Encounter (Signed)
Call in Zocor 20 mg a day #90 with 3 rf and stop the Lipitor

## 2012-12-27 MED ORDER — SIMVASTATIN 20 MG PO TABS: 20.0000 mg | ORAL_TABLET | Freq: Every day | ORAL | Status: DC

## 2012-12-27 NOTE — Telephone Encounter (Signed)
I sent script e-scribe and spoke with pt's wife. 

## 2013-01-26 ENCOUNTER — Telehealth: Payer: Self-pay | Admitting: Family Medicine

## 2013-01-26 ENCOUNTER — Telehealth: Payer: Self-pay | Admitting: Hematology & Oncology

## 2013-01-26 NOTE — Telephone Encounter (Signed)
Holly at Dr. Claris Che office aware pt cx 7-31 follow uo

## 2013-01-26 NOTE — Telephone Encounter (Signed)
noted 

## 2013-01-26 NOTE — Telephone Encounter (Signed)
[facility] called. Wanted Dr. Clent Ridges to be aware that this patient did not keep his 12/21/12 appt with them.

## 2013-03-29 ENCOUNTER — Other Ambulatory Visit: Payer: Self-pay

## 2015-09-29 ENCOUNTER — Ambulatory Visit: Payer: Self-pay | Admitting: Family Medicine

## 2015-09-30 ENCOUNTER — Ambulatory Visit (INDEPENDENT_AMBULATORY_CARE_PROVIDER_SITE_OTHER): Payer: Commercial Managed Care - HMO | Admitting: Family Medicine

## 2015-09-30 ENCOUNTER — Encounter: Payer: Self-pay | Admitting: Family Medicine

## 2015-09-30 VITALS — BP 187/125 | HR 83 | Temp 98.4°F | Ht 72.0 in | Wt 245.0 lb

## 2015-09-30 DIAGNOSIS — I10 Essential (primary) hypertension: Secondary | ICD-10-CM

## 2015-09-30 DIAGNOSIS — R739 Hyperglycemia, unspecified: Secondary | ICD-10-CM | POA: Diagnosis not present

## 2015-09-30 LAB — CBC WITH DIFFERENTIAL/PLATELET
BASOS ABS: 0 10*3/uL (ref 0.0–0.1)
Basophils Relative: 0.5 % (ref 0.0–3.0)
EOS ABS: 0.1 10*3/uL (ref 0.0–0.7)
Eosinophils Relative: 1.7 % (ref 0.0–5.0)
HCT: 49.9 % (ref 39.0–52.0)
HEMOGLOBIN: 17 g/dL (ref 13.0–17.0)
LYMPHS PCT: 43.1 % (ref 12.0–46.0)
Lymphs Abs: 2.4 10*3/uL (ref 0.7–4.0)
MCHC: 34.1 g/dL (ref 30.0–36.0)
MCV: 90.5 fl (ref 78.0–100.0)
MONO ABS: 0.4 10*3/uL (ref 0.1–1.0)
Monocytes Relative: 7.2 % (ref 3.0–12.0)
NEUTROS ABS: 2.6 10*3/uL (ref 1.4–7.7)
NEUTROS PCT: 47.5 % (ref 43.0–77.0)
PLATELETS: 226 10*3/uL (ref 150.0–400.0)
RBC: 5.52 Mil/uL (ref 4.22–5.81)
RDW: 13.3 % (ref 11.5–15.5)
WBC: 5.5 10*3/uL (ref 4.0–10.5)

## 2015-09-30 LAB — POC URINALSYSI DIPSTICK (AUTOMATED)
Bilirubin, UA: NEGATIVE
Glucose, UA: NEGATIVE
KETONES UA: NEGATIVE
LEUKOCYTES UA: NEGATIVE
Nitrite, UA: NEGATIVE
PH UA: 6
Spec Grav, UA: 1.025
UROBILINOGEN UA: 0.2

## 2015-09-30 LAB — HEPATIC FUNCTION PANEL
ALT: 46 U/L (ref 0–53)
AST: 25 U/L (ref 0–37)
Albumin: 4.6 g/dL (ref 3.5–5.2)
Alkaline Phosphatase: 55 U/L (ref 39–117)
BILIRUBIN DIRECT: 0.1 mg/dL (ref 0.0–0.3)
BILIRUBIN TOTAL: 0.7 mg/dL (ref 0.2–1.2)
TOTAL PROTEIN: 7.4 g/dL (ref 6.0–8.3)

## 2015-09-30 LAB — BASIC METABOLIC PANEL
BUN: 20 mg/dL (ref 6–23)
CALCIUM: 9.6 mg/dL (ref 8.4–10.5)
CHLORIDE: 98 meq/L (ref 96–112)
CO2: 29 mEq/L (ref 19–32)
CREATININE: 0.93 mg/dL (ref 0.40–1.50)
GFR: 93.49 mL/min (ref >60.00)
Glucose, Bld: 127 mg/dL — ABNORMAL HIGH (ref 70–99)
Potassium: 3.7 mEq/L (ref 3.5–5.1)
Sodium: 138 mEq/L (ref 135–145)

## 2015-09-30 LAB — TSH: TSH: 1.32 u[IU]/mL (ref 0.35–4.50)

## 2015-09-30 MED ORDER — LISINOPRIL-HYDROCHLOROTHIAZIDE 20-12.5 MG PO TABS: 1.0000 | ORAL_TABLET | Freq: Every day | ORAL | Status: DC

## 2015-09-30 MED ORDER — AMLODIPINE BESYLATE 5 MG PO TABS: 5.0000 mg | ORAL_TABLET | Freq: Every day | ORAL | Status: DC

## 2015-09-30 NOTE — Progress Notes (Signed)
Pre visit review using our clinic review tool, if applicable. No additional management support is needed unless otherwise documented below in the visit note. 

## 2015-09-30 NOTE — Progress Notes (Signed)
   Subjective:    Patient ID: Stephen Tanner, male    DOB: 1970/12/31, 45 y.o.   MRN: [id number]  HPI Here for high BP. We have not seen him in almost 3 years. He has had high BP in the past but it has not required treatment until now. He was treated for pneumonia last December and it was high at that time. Then he started checking it at home a month ago. He has consistently been getting systolic readings in the [id number] or 180s at home. He feels some mild SOB at times but denies any chest pains or headaches. He quit smoking some years ago but he still vapes daily.    Review of Systems  Constitutional: Negative.   Respiratory: Negative.   Cardiovascular: Negative.   Neurological: Negative.        Objective:   Physical Exam  Constitutional: He is oriented to person, place, and time. He appears well-developed and well-nourished.  Neck: No thyromegaly present.  Cardiovascular: Normal rate, regular rhythm, normal heart sounds and intact distal pulses.   Pulmonary/Chest: Effort normal and breath sounds normal.  Lymphadenopathy:    He has no cervical adenopathy.  Neurological: He is alert and oriented to person, place, and time.          Assessment & Plan:  HTN. Fir st we discussed reducing the sodium in his diet and getting more exercise. I advised him to stop vaping immediately. Start on Lisinopril HCT and Amlodipine. Get labs today. Recheck in 3 weeks. Laurey Morale, MD

## 2015-10-02 NOTE — Addendum Note (Signed)
Addended by: Alysia Penna A on: 10/02/2015 10:18 AM   Modules accepted: Orders

## 2015-10-21 ENCOUNTER — Ambulatory Visit (INDEPENDENT_AMBULATORY_CARE_PROVIDER_SITE_OTHER): Payer: Commercial Managed Care - HMO | Admitting: Family Medicine

## 2015-10-21 ENCOUNTER — Encounter: Payer: Self-pay | Admitting: Family Medicine

## 2015-10-21 VITALS — BP 140/103 | HR 98 | Temp 98.4°F | Ht 72.0 in | Wt 243.0 lb

## 2015-10-21 DIAGNOSIS — I10 Essential (primary) hypertension: Secondary | ICD-10-CM | POA: Diagnosis not present

## 2015-10-21 DIAGNOSIS — R739 Hyperglycemia, unspecified: Secondary | ICD-10-CM | POA: Diagnosis not present

## 2015-10-21 LAB — LIPID PANEL
CHOL/HDL RATIO: 4
CHOLESTEROL: 254 mg/dL — AB (ref 0–200)
HDL: 58 mg/dL (ref >39.00)
LDL CALC: 169 mg/dL — AB (ref 0–99)
NONHDL: 196.12
Triglycerides: 136 mg/dL (ref 0.0–149.0)
VLDL: 27.2 mg/dL (ref 0.0–40.0)

## 2015-10-21 LAB — HEMOGLOBIN A1C: HEMOGLOBIN A1C: 7.3 % — AB (ref 4.6–6.5)

## 2015-10-21 MED ORDER — LISINOPRIL 20 MG PO TABS: 20.0000 mg | ORAL_TABLET | Freq: Every day | ORAL | Status: DC

## 2015-10-21 MED ORDER — AMLODIPINE BESYLATE 10 MG PO TABS: 10.0000 mg | ORAL_TABLET | Freq: Every day | ORAL | Status: DC

## 2015-10-21 NOTE — Progress Notes (Signed)
Pre visit review using our clinic review tool, if applicable. No additional management support is needed unless otherwise documented below in the visit note. 

## 2015-10-21 NOTE — Progress Notes (Signed)
   Subjective:    Patient ID: Stephen Tanner, male    DOB: 11-29-1970, 45 y.o.   MRN: [id number]  HPI Here to follow up on HTN. He feels better in most ways and his BP at home has been better in the 120s-130s over 90s. However he does report intermittent "kidney pains" in the back. He has tried drinking more water but this does not help. He thinks it is from the diuretic med.    Review of Systems  Constitutional: Negative.   Respiratory: Negative.   Cardiovascular: Negative.   Genitourinary: Negative.   Musculoskeletal: Positive for back pain.  Neurological: Negative.        Objective:   Physical Exam  Constitutional: He is oriented to person, place, and time. He appears well-developed and well-nourished.  Cardiovascular: Normal rate, regular rhythm, normal heart sounds and intact distal pulses.   Pulmonary/Chest: Effort normal and breath sounds normal.  Musculoskeletal: He exhibits no edema.  Neurological: He is alert and oriented to person, place, and time.          Assessment & Plan:  His HTN is under much better control, but he seems to be having side effects from the HCTZ. We will stop this and use straight Lisinopril 20 mg a day, and we will increase Amlodipine to 10 mg a day. Get fasting labs.  Laurey Morale, MD

## 2015-10-27 ENCOUNTER — Encounter: Payer: Self-pay | Admitting: Family Medicine

## 2015-10-28 ENCOUNTER — Telehealth: Payer: Self-pay | Admitting: Family Medicine

## 2015-10-28 ENCOUNTER — Other Ambulatory Visit: Payer: Self-pay | Admitting: *Deleted

## 2015-10-28 MED ORDER — METFORMIN HCL 500 MG PO TABS: 500.0000 mg | ORAL_TABLET | Freq: Two times a day (BID) | ORAL | Status: DC

## 2015-10-28 NOTE — Telephone Encounter (Signed)
Would like a call back to discuss more about being a diabetic   [phone]

## 2015-10-28 NOTE — Telephone Encounter (Signed)
Wife would like to know if the Rx for Metformin was called in and what pharmacy was it called in to?  Pt rec'd the labs and other information and was not happy do to the fact the information that was noted and nothing was done as of today.

## 2015-10-28 NOTE — Telephone Encounter (Signed)
Rx sent to the pharmacy this morning.

## 2015-10-29 NOTE — Telephone Encounter (Signed)
Unable to leave message

## 2015-10-31 NOTE — Telephone Encounter (Signed)
Called patient.  No answer.

## 2015-11-04 ENCOUNTER — Ambulatory Visit (INDEPENDENT_AMBULATORY_CARE_PROVIDER_SITE_OTHER): Payer: Commercial Managed Care - HMO | Admitting: Family Medicine

## 2015-11-04 ENCOUNTER — Encounter: Payer: Self-pay | Admitting: Family Medicine

## 2015-11-04 VITALS — BP 122/90 | HR 98 | Temp 98.1°F | Ht 72.0 in | Wt 234.5 lb

## 2015-11-04 DIAGNOSIS — J069 Acute upper respiratory infection, unspecified: Secondary | ICD-10-CM | POA: Diagnosis not present

## 2015-11-04 DIAGNOSIS — I10 Essential (primary) hypertension: Secondary | ICD-10-CM

## 2015-11-04 DIAGNOSIS — E119 Type 2 diabetes mellitus without complications: Secondary | ICD-10-CM | POA: Diagnosis not present

## 2015-11-04 NOTE — Patient Instructions (Signed)
Keep your follow-up with your doctor as scheduled  Congratulations on a healthy lifestyle! Please continue the healthy diet and regular exercise and continue to monitor sugars. Goal fasting blood sugars are between 70 and 130. I would recommend that you try to keep your postprandial sugars under 170, and under 150 would be best. Call if you are experiencing any high or low blood sugars or you have any concerns.  Continue your blood pressure medications.  INSTRUCTIONS FOR UPPER RESPIRATORY INFECTION:  -plenty of rest and fluids  -nasal saline wash 2-3 times daily (use prepackaged nasal saline or bottled/distilled water if making your own)   -can use AFRIN nasal spray for drainage and nasal congestion - but do NOT use longer then 3-4 days  -can use tylenol as directed for aches and sorethroat if needed  -in the winter time, using a humidifier at night is helpful (please follow cleaning instructions)  -if you are taking a cough medication - use only as directed, may also try a teaspoon of honey to coat the throat and throat lozenges.  -for sore throat, salt water gargles can help  -follow up if you have fevers, facial pain, tooth pain, difficulty breathing or are worsening or symptoms persist longer then expected  Upper Respiratory Infection, Adult An upper respiratory infection (URI) is also known as the common cold. It is often caused by a type of germ (virus). Colds are easily spread (contagious). You can pass it to others by kissing, coughing, sneezing, or drinking out of the same glass. Usually, you get better in 1 to 3  weeks.  However, the cough can last for even longer. HOME CARE   Only take medicine as told by your doctor. Follow instructions provided above.  Drink enough water and fluids to keep your pee (urine) clear or pale yellow.  Get plenty of rest.  Return to work when your temperature is < 100 for 24 hours or as told by your doctor. You may use a face mask and wash  your hands to stop your cold from spreading. GET HELP RIGHT AWAY IF:   After the first few days, you feel you are getting worse.  You have questions about your medicine.  You have chills, shortness of breath, or red spit (mucus).  You have pain in the face for more then 1-2 days, especially when you bend forward.  You have a fever, puffy (swollen) neck, pain when you swallow, or white spots in the back of your throat.  You have a bad headache, ear pain, sinus pain, or chest pain.  You have a high-pitched whistling sound when you breathe in and out (wheezing).  You cough up blood.  You have sore muscles or a stiff neck. MAKE SURE YOU:   Understand these instructions.  Will watch your condition.  Will get help right away if you are not doing well or get worse. Document Released: 10/27/2007 Document Revised: 08/02/2011 Document Reviewed: 08/15/2013 [facility] Patient Information 2015 [place], [place]. This information is not intended to replace advice given to you by your health care provider. Make sure you discuss any questions you have with your health care provider.

## 2015-11-04 NOTE — Progress Notes (Signed)
Pre visit review using our clinic review tool, if applicable. No additional management support is needed unless otherwise documented below in the visit note. 

## 2015-11-04 NOTE — Progress Notes (Signed)
HPI:  Acute visit for several concerns:  URI -started: 2 days ago -symptoms:nasal congestion, sore throat, cough, low grade sub fever -denies:fever, SOB, NVD, tooth pain -has tried:  -sick contacts/travel/risks: no reported flu, strep or tick exposure - many colleagues with the same symptoms -Hx of: allergies  DM: -reports dx recently and rx for metformin given -reports has made dramatic lifestyle changes and is checking sugars daily -Sugars fasting have all been under 110, postprandial sugars have all been mostly under 140, with one postprandial 151 -He does not want to take the metformin  Hypertension: -On several medications for this, and reports this is much better than in the past -He has follow-up with his doctor for this -No chest pain, shortness of breath or swelling ROS: See pertinent positives and negatives per HPI.  Past Medical History  Diagnosis Date  . Migraines   . Colon cancer [facility])     rectal adenocarcinoma, sees Dr. Burney Gauze     Past Surgical History  Procedure Laterality Date  . Appendectomy    . Tonsillectomy and adenoidectomy    . Tympanostomy tube placement    . Colon surgery  04-19-09    left hemicolectomy, has a permanent colostomy, anus is closed    Family History  Problem Relation Age of Onset  . Hypertension Mother   . Hypertension Father     Social History   Social History  . Marital Status: Married    Spouse Name: N/A  . Number of Children: N/A  . Years of Education: N/A   Social History Main Topics  . Smoking status: Former Smoker    Types: Cigarettes    Quit date: 04/12/1913  . Smokeless tobacco: Never Used     Comment: vapor  . Alcohol Use: 0.6 oz/week    1 Standard drinks or equivalent per week  . Drug Use: Yes    Special: "Crack" cocaine, Heroin, Marijuana     Comment: in the past  . Sexual Activity: Not Asked   Other Topics Concern  . None   Social History Narrative     Current outpatient  prescriptions:  .  amLODipine (NORVASC) 10 MG tablet, Take 1 tablet (10 mg total) by mouth daily., Disp: 90 tablet, Rfl: 3 .  EPINEPHrine (EPIPEN 2-PAK) 0.3 mg/0.3 mL DEVI, Inject 0.3 mLs (0.3 mg total) into the muscle once., Disp: 1 Device, Rfl: 5 .  lisinopril (PRINIVIL,ZESTRIL) 20 MG tablet, Take 1 tablet (20 mg total) by mouth daily., Disp: 90 tablet, Rfl: 3 .  metFORMIN (GLUCOPHAGE) 500 MG tablet, Take 1 tablet (500 mg total) by mouth 2 (two) times daily with a meal., Disp: 60 tablet, Rfl: 2 .  SUMAtriptan (IMITREX) 20 MG/ACT nasal spray, 1 spray (20 mg total) by Nasal route as needed for migraine (not to exceed 40mg  daily)., Disp: 1 Inhaler, Rfl: 6  EXAM:  Filed Vitals:   11/04/15 0934  BP: 122/90  Pulse: 98  Temp: 98.1 F (36.7 C)    Body mass index is 31.8 kg/(m^2).  GENERAL: vitals reviewed and listed above, alert, oriented, appears well hydrated and in no acute distress  HEENT: atraumatic, conjunttiva clear, no obvious abnormalities on inspection of external nose and ears, normal appearance of ear canals and TMs, clear nasal congestion, mild post oropharyngeal erythema with PND, no tonsillar edema or exudate, no sinus TTP  NECK: no obvious masses on inspection  LUNGS: clear to auscultation bilaterally, no wheezes, rales or rhonchi, good air movement  CV: HRRR, no  peripheral edema  MS: moves all extremities without noticeable abnormality  PSYCH: pleasant and cooperative, no obvious depression or anxiety  ASSESSMENT AND PLAN:  Discussed the following assessment and plan:  URI (upper respiratory infection)  Type 2 diabetes mellitus without complication, without long-term current use of insulin (HCC)  Essential hypertension  -given HPI and exam findings today, a serious infection or illness is unlikely. We discussed potential etiologies, with VURI being most likely, and advised supportive care and monitoring. We discussed treatment side effects, likely course,  antibiotic misuse, transmission, and signs of developing a serious illness. -Congratulated on changes, importance of lifestyle discussed, he wishes to continue with these and hold off on metformin for now - discussed risks/benefits -his BP is much better, he prefers to continue current medications and continue to work on lifestyle rather then increasing medications further today - he tells me he has follow-up planned with his primary doctor. -of course, we advised to return or notify a doctor immediately if symptoms worsen or persist or new concerns arise.    Patient Instructions  Keep your follow-up with your doctor as scheduled  Congratulations on a healthy lifestyle! Please continue the healthy diet and regular exercise and continue to monitor sugars. Goal fasting blood sugars are between 70 and 130. I would recommend that you try to keep your postprandial sugars under 170, and under 150 would be best. Call if you are experiencing any high or low blood sugars or you have any concerns.  Continue your blood pressure medications.  INSTRUCTIONS FOR UPPER RESPIRATORY INFECTION:  -plenty of rest and fluids  -nasal saline wash 2-3 times daily (use prepackaged nasal saline or bottled/distilled water if making your own)   -can use AFRIN nasal spray for drainage and nasal congestion - but do NOT use longer then 3-4 days  -can use tylenol as directed for aches and sorethroat if needed  -in the winter time, using a humidifier at night is helpful (please follow cleaning instructions)  -if you are taking a cough medication - use only as directed, may also try a teaspoon of honey to coat the throat and throat lozenges.  -for sore throat, salt water gargles can help  -follow up if you have fevers, facial pain, tooth pain, difficulty breathing or are worsening or symptoms persist longer then expected  Upper Respiratory Infection, Adult An upper respiratory infection (URI) is also known as the  common cold. It is often caused by a type of germ (virus). Colds are easily spread (contagious). You can pass it to others by kissing, coughing, sneezing, or drinking out of the same glass. Usually, you get better in 1 to 3  weeks.  However, the cough can last for even longer. HOME CARE   Only take medicine as told by your doctor. Follow instructions provided above.  Drink enough water and fluids to keep your pee (urine) clear or pale yellow.  Get plenty of rest.  Return to work when your temperature is < 100 for 24 hours or as told by your doctor. You may use a face mask and wash your hands to stop your cold from spreading. GET HELP RIGHT AWAY IF:   After the first few days, you feel you are getting worse.  You have questions about your medicine.  You have chills, shortness of breath, or red spit (mucus).  You have pain in the face for more then 1-2 days, especially when you bend forward.  You have a fever, puffy (swollen) neck, pain  when you swallow, or white spots in the back of your throat.  You have a bad headache, ear pain, sinus pain, or chest pain.  You have a high-pitched whistling sound when you breathe in and out (wheezing).  You cough up blood.  You have sore muscles or a stiff neck. MAKE SURE YOU:   Understand these instructions.  Will watch your condition.  Will get help right away if you are not doing well or get worse. Document Released: 10/27/2007 Document Revised: 08/02/2011 Document Reviewed: 08/15/2013 [facility] Patient Information 2015 [place], [place]. This information is not intended to replace advice given to you by your health care provider. Make sure you discuss any questions you have with your health care provider.      Stephen Tanner R.

## 2016-01-19 ENCOUNTER — Encounter: Payer: Self-pay | Admitting: Family Medicine

## 2016-01-19 ENCOUNTER — Ambulatory Visit (INDEPENDENT_AMBULATORY_CARE_PROVIDER_SITE_OTHER): Payer: Commercial Managed Care - HMO | Admitting: Family Medicine

## 2016-01-19 VITALS — BP 115/81 | HR 72 | Temp 98.3°F | Ht 72.0 in | Wt 227.0 lb

## 2016-01-19 DIAGNOSIS — I10 Essential (primary) hypertension: Secondary | ICD-10-CM

## 2016-01-19 DIAGNOSIS — E119 Type 2 diabetes mellitus without complications: Secondary | ICD-10-CM | POA: Diagnosis not present

## 2016-01-19 LAB — HEMOGLOBIN A1C: Hgb A1c MFr Bld: 5.7 % (ref 4.6–6.5)

## 2016-01-19 NOTE — Progress Notes (Signed)
Pre visit review using our clinic review tool, if applicable. No additional management support is needed unless otherwise documented below in the visit note. 

## 2016-01-19 NOTE — Progress Notes (Signed)
   Subjective:    Patient ID: Stephen Tanner, male    DOB: 05/06/1971, 45 y.o.   MRN: [id number]  HPI Here to follow up. He feels great. His BP is stable. He has changed his diet and lost weight, and he stopped taking Merformin. His am fasting glucoses are now 105-110 most of the time.    Review of Systems  Constitutional: Negative.   Respiratory: Negative.   Cardiovascular: Negative.   Neurological: Negative.        Objective:   Physical Exam  Constitutional: He appears well-developed and well-nourished.  Cardiovascular: Normal rate, regular rhythm, normal heart sounds and intact distal pulses.   Pulmonary/Chest: Effort normal and breath sounds normal.          Assessment & Plan:  His HTN is stable and it sounds like his diabetes is doing well off medications. We will check an A1c today.  Laurey Morale, MD

## 2016-08-11 ENCOUNTER — Encounter: Payer: Self-pay | Admitting: Family Medicine

## 2016-08-18 NOTE — Telephone Encounter (Signed)
We have not seen him since last August. He needs an OV for me to adequately assess the problem

## 2016-11-29 ENCOUNTER — Other Ambulatory Visit: Payer: Self-pay | Admitting: Family Medicine

## 2017-02-10 ENCOUNTER — Encounter: Payer: Self-pay | Admitting: Family Medicine

## 2017-04-12 ENCOUNTER — Other Ambulatory Visit: Payer: Self-pay | Admitting: Family Medicine

## 2017-04-13 ENCOUNTER — Ambulatory Visit: Payer: 59 | Admitting: Family Medicine

## 2017-04-13 ENCOUNTER — Encounter: Payer: Self-pay | Admitting: Family Medicine

## 2017-04-13 VITALS — BP 180/110 | HR 104 | Temp 98.2°F | Wt 242.2 lb

## 2017-04-13 DIAGNOSIS — E119 Type 2 diabetes mellitus without complications: Secondary | ICD-10-CM | POA: Diagnosis not present

## 2017-04-13 DIAGNOSIS — G4730 Sleep apnea, unspecified: Secondary | ICD-10-CM | POA: Diagnosis not present

## 2017-04-13 DIAGNOSIS — I10 Essential (primary) hypertension: Secondary | ICD-10-CM | POA: Diagnosis not present

## 2017-04-13 MED ORDER — AMLODIPINE BESYLATE 10 MG PO TABS: 10.0000 mg | ORAL_TABLET | Freq: Every day | ORAL | 3 refills | Status: DC

## 2017-04-13 MED ORDER — LISINOPRIL 40 MG PO TABS: 40.0000 mg | ORAL_TABLET | Freq: Every day | ORAL | 3 refills | Status: DC

## 2017-04-13 NOTE — Progress Notes (Signed)
   Subjective:    Patient ID: Stephen Tanner, male    DOB: 1970-09-06, 46 y.o.   MRN: [id number]  HPI Here to discuss several issues. First he has run out of BP meds and he asks for refills. He normally checks his BP at home and it runs in the 120s or 130s over 90s mostly. He feels well. He follows his am fasting glucoses 2-3 times a week and thay are stable around 115. He asks to be be checked for sleep apnea. He snores loudly and other people say he stops breathing frequently at night. He admits to feeling tired and sleepy all day and even gets sleepy while driving.    Review of Systems  Constitutional: Positive for fatigue.  Respiratory: Negative.   Cardiovascular: Negative.   Neurological: Negative.        Objective:   Physical Exam  Constitutional: He is oriented to person, place, and time. He appears well-developed and well-nourished.  Neck: No thyromegaly present.  Cardiovascular: Normal rate, regular rhythm, normal heart sounds and intact distal pulses.  Pulmonary/Chest: Effort normal and breath sounds normal. No respiratory distress. He has no wheezes. He has no rales.  Lymphadenopathy:    He has no cervical adenopathy.  Neurological: He is alert and oriented to person, place, and time.          Assessment & Plan:  For the HTN we will refill the Amlodipine and increase the Lisinopril to 40 mg a day. Recheck in one month with a well exam and fasting labs. The diabetes seems to be stable. Refer to Pulmonary for the sleep apnea.  Alysia Penna, MD

## 2017-04-28 ENCOUNTER — Encounter: Payer: Self-pay | Admitting: Family Medicine

## 2017-05-05 ENCOUNTER — Encounter: Payer: Self-pay | Admitting: Family Medicine

## 2017-05-05 NOTE — Telephone Encounter (Signed)
This has been addressed with pt today

## 2017-05-12 ENCOUNTER — Encounter: Payer: 59 | Admitting: Family Medicine

## 2017-05-30 ENCOUNTER — Encounter: Payer: Self-pay | Admitting: Pulmonary Disease

## 2017-06-02 ENCOUNTER — Institutional Professional Consult (permissible substitution): Payer: 59 | Admitting: Pulmonary Disease

## 2017-06-06 ENCOUNTER — Ambulatory Visit: Payer: 59 | Admitting: Pulmonary Disease

## 2017-06-06 ENCOUNTER — Encounter: Payer: Self-pay | Admitting: Pulmonary Disease

## 2017-06-06 VITALS — BP 142/62 | HR 107 | Ht 73.0 in | Wt 236.0 lb

## 2017-06-06 DIAGNOSIS — R29818 Other symptoms and signs involving the nervous system: Secondary | ICD-10-CM | POA: Diagnosis not present

## 2017-06-06 NOTE — Progress Notes (Signed)
[facility]  Chief Complaint  Patient presents with  . Sleep Consult    referred by Dr. Sarajane Jews for OSA. Epworth Score: 22.    Vital signs: BP (!) 142/62 (BP Location: Left Arm, Patient Position: Sitting, Cuff Size: Normal)   Pulse (!) 107   Ht 6\' 1"  (1.854 m)   Wt 236 lb (107 kg)   SpO2 96%   BMI 31.14 kg/m   History of Present Illness:  Stephen Tanner is a 47 y.o. male for evaluation of sleep problems.  He has noticed trouble with his sleep for years.  He snores, and stops breathing while asleep.  He gets a dry mouth.  He doesn't dream much at night.  He can fall asleep easily whenever he is sitting quiet.  He goes to sleep at 10 pm.  He falls asleep instantly.  He wakes up several times to use the bathroom.  He gets out of bed at 6 am.  He feels tired in the morning.  He denies morning headache.  He does not use anything to help him fall sleep.  He drinks soda to help stay awake.  He denies sleep walking, sleep talking, bruxism, or nightmares.  There is no history of restless legs.  He denies sleep hallucinations, sleep paralysis, or cataplexy.  The Epworth score is 23 out of 24.   Physical Exam:  General - pleasant Eyes - pupils reactive ENT - no sinus tenderness, no oral exudate, no LAN, MP 4, high arched palate Cardiac - regular, no murmur Chest - no wheeze, rales Abd - soft, non tender Ext - no edema Skin - no rashes Neuro - normal strength Psych - normal mood  Discussion: He has snoring, sleep disruption, daytime sleepiness, and witnessed apnea.  He has history of hypertension on several medications.  I am concerned he could have sleep apnea.  We discussed how sleep apnea can affect various health problems, including risks for hypertension, cardiovascular disease, and diabetes.  We also discussed how sleep disruption can increase risks for accidents, such as while driving.  Weight loss as a means of improving sleep apnea was  also reviewed.  Additional treatment options discussed were CPAP therapy, oral appliance, and surgical intervention.  Assessment/Plan:  Suspected sleep apnea. - will arrange for home sleep study to further assess   Patient Instructions  Will arrange for home sleep study Will call to arrange for follow up after sleep study reviewed     Chesley Mires, MD [place] 06/06/2017, 4:21 PM Pager:  [phone]  Flow Sheet  Pulmonary tests:  Sleep tests:  Cardiac tests:  Events:  Review of Systems: Constitutional: Negative for fever and unexpected weight change.  HENT: Negative for congestion, dental problem, ear pain, nosebleeds, postnasal drip, rhinorrhea, sinus pressure, sneezing, sore throat and trouble swallowing.   Eyes: Negative for redness and itching.  Respiratory: Negative for cough, chest tightness, shortness of breath and wheezing.   Cardiovascular: Negative for palpitations and leg swelling.  Gastrointestinal: Negative for nausea and vomiting.  Genitourinary: Negative for dysuria.  Musculoskeletal: Negative for joint swelling.  Skin: Negative for rash.  Neurological: Negative for headaches.  Hematological: Does not bruise/bleed easily.  Psychiatric/Behavioral: Negative for dysphoric mood. The patient is not nervous/anxious.    Past Medical History: He  has a past medical history of Colon cancer ([place]), Hypertension, and Migraines.  Past Surgical History: He  has a past surgical history that includes Appendectomy; Tonsillectomy and adenoidectomy; Tympanostomy  tube placement; and Colon surgery (04-19-09).  Family History: His family history includes Hypertension in his father and mother.  Social History: He  reports that he quit smoking about 6 years ago. His smoking use included cigarettes. he has never used smokeless tobacco. He reports that he drinks about 0.6 oz of alcohol per week. He reports that he uses drugs. Drugs: "Crack" cocaine,  Heroin, and Marijuana.  Medications: Allergies as of 06/06/2017      Reactions   Lipitor [atorvastatin]    Fatigue   Penicillins    Prednisone    Unwanted side effects lethargic   Sulfa Antibiotics       Medication List        Accurate as of 06/06/17  4:21 PM. Always use your most recent med list.          amLODipine 10 MG tablet Commonly known as:  NORVASC Take 1 tablet (10 mg total) by mouth daily.   EPINEPHrine 0.3 mg/0.3 mL Devi Commonly known as:  EPIPEN 2-PAK Inject 0.3 mLs (0.3 mg total) into the muscle once.   lisinopril 40 MG tablet Commonly known as:  PRINIVIL,ZESTRIL Take 1 tablet (40 mg total) by mouth daily.   SUMAtriptan 20 MG/ACT nasal spray Commonly known as:  IMITREX 1 spray (20 mg total) by Nasal route as needed for migraine (not to exceed 40mg  daily).

## 2017-06-06 NOTE — Progress Notes (Signed)
   Subjective:    Patient ID: Stephen Tanner, male    DOB: 1970/10/22, 47 y.o.   MRN: [id number]  HPI    Review of Systems  Constitutional: Negative for fever and unexpected weight change.  HENT: Negative for congestion, dental problem, ear pain, nosebleeds, postnasal drip, rhinorrhea, sinus pressure, sneezing, sore throat and trouble swallowing.   Eyes: Negative for redness and itching.  Respiratory: Negative for cough, chest tightness, shortness of breath and wheezing.   Cardiovascular: Negative for palpitations and leg swelling.  Gastrointestinal: Negative for nausea and vomiting.  Genitourinary: Negative for dysuria.  Musculoskeletal: Negative for joint swelling.  Skin: Negative for rash.  Neurological: Negative for headaches.  Hematological: Does not bruise/bleed easily.  Psychiatric/Behavioral: Negative for dysphoric mood. The patient is not nervous/anxious.        Objective:   Physical Exam        Assessment & Plan:

## 2017-06-06 NOTE — Patient Instructions (Signed)
Will arrange for home sleep study Will call to arrange for follow up after sleep study reviewed  

## 2017-06-20 DIAGNOSIS — G4733 Obstructive sleep apnea (adult) (pediatric): Secondary | ICD-10-CM | POA: Diagnosis not present

## 2017-06-21 DIAGNOSIS — G4733 Obstructive sleep apnea (adult) (pediatric): Secondary | ICD-10-CM | POA: Diagnosis not present

## 2017-06-25 ENCOUNTER — Encounter: Payer: Self-pay | Admitting: Pulmonary Disease

## 2017-06-29 ENCOUNTER — Telehealth: Payer: Self-pay

## 2017-06-29 DIAGNOSIS — G4733 Obstructive sleep apnea (adult) (pediatric): Secondary | ICD-10-CM

## 2017-06-29 NOTE — Telephone Encounter (Signed)
RA, pt is upset that he has not received his HST results and states that this is not okay for him to wait until he comes back. Can you please review the results? Thanks!

## 2017-06-29 NOTE — Telephone Encounter (Signed)
Per RA- sleep study showed severe OSA with 79 events per hour. Recommend auto cpap 5-20cm with mask of choice. DL in 4-6 weeks & OV with VS.   Pt is aware of results and voiced his understanding.  cpap therapy has been ordered.  Pt has been scheduled for OV on 08/15/17 with VS. Nothing further is needed.

## 2017-06-30 ENCOUNTER — Other Ambulatory Visit: Payer: Self-pay | Admitting: *Deleted

## 2017-06-30 DIAGNOSIS — R29818 Other symptoms and signs involving the nervous system: Secondary | ICD-10-CM

## 2017-07-08 DIAGNOSIS — G4733 Obstructive sleep apnea (adult) (pediatric): Secondary | ICD-10-CM | POA: Diagnosis not present

## 2017-08-05 DIAGNOSIS — G4733 Obstructive sleep apnea (adult) (pediatric): Secondary | ICD-10-CM | POA: Diagnosis not present

## 2017-08-15 ENCOUNTER — Encounter: Payer: Self-pay | Admitting: Pulmonary Disease

## 2017-08-15 ENCOUNTER — Ambulatory Visit (INDEPENDENT_AMBULATORY_CARE_PROVIDER_SITE_OTHER): Payer: 59 | Admitting: Pulmonary Disease

## 2017-08-15 VITALS — BP 150/100 | HR 78 | Ht 72.0 in | Wt 242.0 lb

## 2017-08-15 DIAGNOSIS — Z9989 Dependence on other enabling machines and devices: Secondary | ICD-10-CM

## 2017-08-15 DIAGNOSIS — G4733 Obstructive sleep apnea (adult) (pediatric): Secondary | ICD-10-CM | POA: Diagnosis not present

## 2017-08-15 NOTE — Patient Instructions (Signed)
Will arrange for CPAP mask refitting  Follow up in 1 year 

## 2017-08-15 NOTE — Progress Notes (Signed)
[facility]  Chief Complaint  Patient presents with  . Follow-up    CPAP     Vital signs: BP (!) 150/100 (BP Location: Left Arm, Cuff Size: Normal)   Pulse 78   Ht 6' (1.829 m)   Wt 242 lb (109.8 kg)   SpO2 95%   BMI 32.82 kg/m   History of Present Illness:  Stephen Tanner is a 47 y.o. male with obstructive sleep apnea.  Since his last visit he had home sleep study.  Showed severe sleep apnea.  Started on auto CPAP.  He is amazed at how much better he is sleeping and feels during the day.  His only problem is with mask fit.  He has nasal pillows.  Gets frequent leaks.  Has to clamp down on his mask, and then gets sore on his cheeks.   Physical Exam:  General - pleasant Eyes - pupils reactive ENT - no sinus tenderness, no oral exudate, no LAN, MP 4, high arched palate Cardiac - regular, no murmur Chest - no wheeze, rales Abd - soft, non tender Ext - no edema Skin - no rashes Neuro - normal strength Psych - normal mood  Assessment/Plan:  Obstructive sleep apnea. - he is compliant with CPAP and reports benefit from therapy - continue auto CPAP - will have his DME refit his mask  Elevated blood pressure. - f/u with PCP   Patient Instructions  Will arrange for CPAP mask refitting  Follow up in 1 year    Chesley Mires, MD [facility] 08/15/2017, 4:38 PM Pager:  [phone]  Flow Sheet  Sleep tests: HST 06/20/17 >> AHI 78.9, SaO2 low 49% Auto CPAP 07/13/17 to 08/11/17 >> used on 30 of 30 nights with average 6 hrs 56 min.  Average AHI 3.7 with median CPAP 12 and 95 th percentile CPAP 15 cm H2O  Past Medical History: He  has a past medical history of Colon cancer ([place]), Hypertension, and Migraines.  Past Surgical History: He  has a past surgical history that includes Appendectomy; Tonsillectomy and adenoidectomy; Tympanostomy tube placement; and Colon surgery (04-19-09).  Family History: His family  history includes Hypertension in his father and mother.  Social History: He  reports that he quit smoking about 6 years ago. His smoking use included cigarettes. He has never used smokeless tobacco. He reports that he drinks about 0.6 oz of alcohol per week. He reports that he has current or past drug history. Drugs: "Crack" cocaine, Heroin, and Marijuana.  Medications: Allergies as of 08/15/2017      Reactions   Lipitor [atorvastatin]    Fatigue   Penicillins    Prednisone    Unwanted side effects lethargic   Sulfa Antibiotics       Medication List        Accurate as of 08/15/17  4:38 PM. Always use your most recent med list.          amLODipine 10 MG tablet Commonly known as:  NORVASC Take 1 tablet (10 mg total) by mouth daily.   EPINEPHrine 0.3 mg/0.3 mL Devi Commonly known as:  EPIPEN 2-PAK Inject 0.3 mLs (0.3 mg total) into the muscle once.   lisinopril 40 MG tablet Commonly known as:  PRINIVIL,ZESTRIL Take 1 tablet (40 mg total) by mouth daily.   SUMAtriptan 20 MG/ACT nasal spray Commonly known as:  IMITREX 1 spray (20 mg total) by Nasal route as needed for migraine (not to exceed 40mg  daily).

## 2017-09-05 ENCOUNTER — Telehealth: Payer: Self-pay | Admitting: Pulmonary Disease

## 2017-09-05 DIAGNOSIS — G4733 Obstructive sleep apnea (adult) (pediatric): Secondary | ICD-10-CM | POA: Diagnosis not present

## 2017-09-05 NOTE — Telephone Encounter (Signed)
Pt wife calling back. Per wife, Stephen Tanner, she called [facility] and they have received the order for the new Cpap mask. Per Stephen Tanner, nothing further is needed and no call back required.

## 2017-10-05 DIAGNOSIS — G4733 Obstructive sleep apnea (adult) (pediatric): Secondary | ICD-10-CM | POA: Diagnosis not present

## 2017-11-05 DIAGNOSIS — G4733 Obstructive sleep apnea (adult) (pediatric): Secondary | ICD-10-CM | POA: Diagnosis not present

## 2017-12-05 DIAGNOSIS — G4733 Obstructive sleep apnea (adult) (pediatric): Secondary | ICD-10-CM | POA: Diagnosis not present

## 2017-12-21 DIAGNOSIS — G4733 Obstructive sleep apnea (adult) (pediatric): Secondary | ICD-10-CM | POA: Diagnosis not present

## 2018-01-05 DIAGNOSIS — G4733 Obstructive sleep apnea (adult) (pediatric): Secondary | ICD-10-CM | POA: Diagnosis not present

## 2018-02-05 DIAGNOSIS — G4733 Obstructive sleep apnea (adult) (pediatric): Secondary | ICD-10-CM | POA: Diagnosis not present

## 2018-03-07 DIAGNOSIS — G4733 Obstructive sleep apnea (adult) (pediatric): Secondary | ICD-10-CM | POA: Diagnosis not present

## 2018-03-20 DIAGNOSIS — G4733 Obstructive sleep apnea (adult) (pediatric): Secondary | ICD-10-CM | POA: Diagnosis not present

## 2018-03-28 DIAGNOSIS — G4733 Obstructive sleep apnea (adult) (pediatric): Secondary | ICD-10-CM

## 2018-03-29 ENCOUNTER — Telehealth: Payer: Self-pay | Admitting: Pulmonary Disease

## 2018-03-29 NOTE — Telephone Encounter (Signed)
rec'd letter of medical necessity letter from [email or web address] today Supplier phone [phone] fax [phone] Fax needs to be signed, completed by VS He is out of the office until 04/03/18 The fax is placed in his green to do folder Once completed and signed by VS, needs to be faxed to [phone]  VS please advise once this fax is completed and signed. Thank you.

## 2018-04-07 DIAGNOSIS — G4733 Obstructive sleep apnea (adult) (pediatric): Secondary | ICD-10-CM | POA: Diagnosis not present

## 2018-04-10 NOTE — Telephone Encounter (Signed)
VS will be back in office on 04/11/18 will check at that time.

## 2018-04-11 NOTE — Telephone Encounter (Signed)
I do not have this form.  Do you have this form?

## 2018-04-12 NOTE — Telephone Encounter (Signed)
Found the form that was already faxed on 04/05/2018 to [email or web address] refaxed form to fax [phone] today Holding form until fax went through completely.

## 2018-04-24 NOTE — Telephone Encounter (Signed)
Fax went through, nothing further needed at this time

## 2018-04-25 ENCOUNTER — Other Ambulatory Visit: Payer: Self-pay | Admitting: Family Medicine

## 2018-05-02 ENCOUNTER — Encounter: Payer: Self-pay | Admitting: Family Medicine

## 2018-05-02 ENCOUNTER — Ambulatory Visit: Payer: 59 | Admitting: Family Medicine

## 2018-05-02 VITALS — BP 140/70 | HR 89 | Temp 98.2°F | Wt 233.0 lb

## 2018-05-02 DIAGNOSIS — E119 Type 2 diabetes mellitus without complications: Secondary | ICD-10-CM

## 2018-05-02 DIAGNOSIS — R51 Headache: Secondary | ICD-10-CM | POA: Diagnosis not present

## 2018-05-02 DIAGNOSIS — I10 Essential (primary) hypertension: Secondary | ICD-10-CM | POA: Diagnosis not present

## 2018-05-02 DIAGNOSIS — G8929 Other chronic pain: Secondary | ICD-10-CM

## 2018-05-02 DIAGNOSIS — R519 Headache, unspecified: Secondary | ICD-10-CM

## 2018-05-02 MED ORDER — AMLODIPINE BESYLATE 10 MG PO TABS: 10.0000 mg | ORAL_TABLET | Freq: Every day | ORAL | 3 refills | Status: DC

## 2018-05-02 MED ORDER — LISINOPRIL 40 MG PO TABS: 40.0000 mg | ORAL_TABLET | Freq: Every day | ORAL | 3 refills | Status: DC

## 2018-05-02 NOTE — Progress Notes (Signed)
   Subjective:    Patient ID: Stephen Tanner, male    DOB: April 14, 1971, 47 y.o.   MRN: [id number]  HPI Here to follow up. He does not check his BP or glucoses. He ran out of BP meds 4 days ago. He feels fine. He has not had a cluster headache in several years.   Review of Systems  Constitutional: Negative.   Respiratory: Negative.   Cardiovascular: Negative.   Neurological: Negative.        Objective:   Physical Exam  Constitutional: He is oriented to person, place, and time. He appears well-developed and well-nourished.  Neck: No thyromegaly present.  Cardiovascular: Normal rate, regular rhythm, normal heart sounds and intact distal pulses.  Pulmonary/Chest: Effort normal and breath sounds normal.  Musculoskeletal: He exhibits no edema.  Lymphadenopathy:    He has no cervical adenopathy.  Neurological: He is alert and oriented to person, place, and time.          Assessment & Plan:  His HTN is stable. He will set up fasting labs soon to check lipids, A1c, etc.  Alysia Penna, MD

## 2018-05-07 DIAGNOSIS — G4733 Obstructive sleep apnea (adult) (pediatric): Secondary | ICD-10-CM | POA: Diagnosis not present

## 2018-06-07 DIAGNOSIS — G4733 Obstructive sleep apnea (adult) (pediatric): Secondary | ICD-10-CM | POA: Diagnosis not present

## 2018-08-15 ENCOUNTER — Ambulatory Visit: Payer: 59 | Admitting: Pulmonary Disease

## 2018-08-28 NOTE — Progress Notes (Signed)
Virtual Visit via Telephone Note  I connected with Stephen Tanner on 08/29/18 at  9:30 AM EDT by telephone and verified that I am speaking with the correct person using two identifiers.   I discussed the limitations, risks, security and privacy concerns of performing an evaluation and management service by telephone and the availability of in person appointments. I also discussed with the patient that there may be a patient responsible charge related to this service. The patient expressed understanding and agreed to proceed.   History of Present Illness: 48 y.o. male with severe obstructive sleep apnea.  Patient consented to consult via telephone: Yes  People present and their role in pt care: Pt   Chief complaint: OSA on CPAP   48 year old male former smoker followed in our office for severe obstructive sleep apnea.  Patient reports that things have been going well since last office visit 1 year ago with Dr. Halford Chessman.  Dr. said wanted the patient to get scheduled for a mask desensitization.  Unfortunately patient's insurance would not cover the cost of this.  Patient has purchased his own mask and is using silicone for a mask seal and he reports this is been going well.  Patient compliance report confirms this.  See compliance report listed below:  07/29/2018-08/27/2018- CPAP compliance report-29 out of last 30 days use, 28 of those days greater than 4 hours, average usage 7 hours and 6 minutes, APAP settings 5-20, AHI 0.6  Patient reports that he has had no issues with CPAP use over the last year.  He does have one concern that he feels that the pressure settings may be slightly too high.  Could consider decreasing to 5-15 as patient's maximum pressure use per compliance report is 14.3.  95th percentile 12.3.  Observations/Objective:  Sleep tests: HST 06/20/17 >> AHI 78.9, SaO2 low 49%  No results found for: NITRICOXIDE  Assessment and Plan:  Severe obstructive sleep apnea Assessment: Severe  obstructive sleep apnea in January/2018 home sleep study AHI 78.9 CPAP compliance report today shows excellent compliance  Plan: Continue CPAP as prescribed We will change CPAP settings to APAP 5-15 based off of patient request We will request a 73-month download to check compliance Follow-up in 1 year    Follow Up Instructions:  Return in about 1 year (around 08/29/2019), or if symptoms worsen or fail to improve, for Follow up with Dr. Halford Chessman, Follow up with Wyn Quaker FNP-C.    I discussed the assessment and treatment plan with the patient. The patient was provided an opportunity to ask questions and all were answered. The patient agreed with the plan and demonstrated an understanding of the instructions.   The patient was advised to call back or seek an in-person evaluation if the symptoms worsen or if the condition fails to improve as anticipated.  I provided 22 minutes of non-face-to-face time during this encounter.   Lauraine Rinne, NP

## 2018-08-29 ENCOUNTER — Encounter: Payer: Self-pay | Admitting: Pulmonary Disease

## 2018-08-29 ENCOUNTER — Other Ambulatory Visit: Payer: Self-pay

## 2018-08-29 ENCOUNTER — Ambulatory Visit (INDEPENDENT_AMBULATORY_CARE_PROVIDER_SITE_OTHER): Payer: 59 | Admitting: Pulmonary Disease

## 2018-08-29 DIAGNOSIS — G4733 Obstructive sleep apnea (adult) (pediatric): Secondary | ICD-10-CM | POA: Diagnosis not present

## 2018-08-29 NOTE — Patient Instructions (Addendum)
DME: [facility]  Change Settings: APAP 5-15  Mask [facility]  Supplies for a year   Return in about 1 year (around 08/29/2019), or if symptoms worsen or fail to improve, for Follow up with Dr. Halford Chessman, Follow up with Wyn Quaker FNP-C.  We recommend that you continue using your CPAP daily >>>Keep up the hard work using your device >>> Goal should be wearing this for the entire night that you are sleeping, at least 4 to 6 hours  Remember:  . Do not drive or operate heavy machinery if tired or drowsy.  . Please notify the supply company and office if you are unable to use your device regularly due to missing supplies or machine being broken.  . Work on maintaining a healthy weight and following your recommended nutrition plan  . Maintain proper daily exercise and movement  . Maintaining proper use of your device can also help improve management of other chronic illnesses such as: Blood pressure, blood sugars, and weight management.   BiPAP/ CPAP Cleaning:  >>>Clean weekly, with Dawn soap, and bottle brush.  Set up to air dry.   Coronavirus (COVID-19) Are you at risk?  Are you at risk for the Coronavirus (COVID-19)?  To be considered HIGH RISK for Coronavirus (COVID-19), you have to meet the following criteria:  . Traveled to [place], [place], [place], [place] or [place]; or in the [place] to [place], [place], [place], or [place]; and have fever, cough, and shortness of breath within the last 2 weeks of travel OR . Been in close contact with a person diagnosed with COVID-19 within the last 2 weeks and have fever, cough, and shortness of breath . IF YOU DO NOT MEET THESE CRITERIA, YOU ARE CONSIDERED LOW RISK FOR COVID-19.  What to do if you are HIGH RISK for COVID-19?  Marland Kitchen If you are having a medical emergency, call 911. . Seek medical care right away. Before you go to a doctor's office, urgent care or emergency department, call ahead and tell them about your recent travel,  contact with someone diagnosed with COVID-19, and your symptoms. You should receive instructions from your physician's office regarding next steps of care.  . When you arrive at healthcare provider, tell the healthcare staff immediately you have returned from visiting [place], [place], [place], [place] or [place]; or traveled in the [place] to [place], [place], [place], or [place]; in the last two weeks or you have been in close contact with a person diagnosed with COVID-19 in the last 2 weeks.   . Tell the health care staff about your symptoms: fever, cough and shortness of breath. . After you have been seen by a medical provider, you will be either: o Tested for (COVID-19) and discharged home on quarantine except to seek medical care if symptoms worsen, and asked to  - Stay home and avoid contact with others until you get your results (4-5 days)  - Avoid travel on public transportation if possible (such as bus, train, or airplane) or o Sent to the Emergency Department by EMS for evaluation, COVID-19 testing, and possible admission depending on your condition and test results.  What to do if you are LOW RISK for COVID-19?  Reduce your risk of any infection by using the same precautions used for avoiding the common cold or flu:  Marland Kitchen Wash your hands often with soap and warm water for at least 20 seconds.  If soap and water are not  readily available, use an alcohol-based hand sanitizer with at least 60% alcohol.  . If coughing or sneezing, cover your mouth and nose by coughing or sneezing into the elbow areas of your shirt or coat, into a tissue or into your sleeve (not your hands). . Avoid shaking hands with others and consider head nods or verbal greetings only. . Avoid touching your eyes, nose, or mouth with unwashed hands.  . Avoid close contact with people who are sick. . Avoid places or events with large numbers of people in one location, like concerts or sporting events. . Carefully  consider travel plans you have or are making. . If you are planning any travel outside or inside the [place], visit the CDC's Travelers' Health webpage for the latest health notices. . If you have some symptoms but not all symptoms, continue to monitor at home and seek medical attention if your symptoms worsen. . If you are having a medical emergency, call 911.   [place] / e-Visit: [email or web address]         [facility]: [facility]: [phone]                   [facility]: [phone]           It is flu season:   >>> Best ways to protect herself from the flu: Receive the yearly flu vaccine, practice good hand hygiene washing with soap and also using hand sanitizer when available, eat a nutritious meals, get adequate rest, hydrate appropriately   Please contact the office if your symptoms worsen or you have concerns that you are not improving.   Thank you for choosing [facility] for your healthcare, and for allowing [place] to partner with you on your healthcare journey. I am thankful to be able to provide care to you today.   Wyn Quaker FNP-C      Living With Sleep Apnea Sleep apnea is a condition in which breathing pauses or becomes shallow during sleep. Sleep apnea is most commonly caused by a collapsed or blocked airway. People with sleep apnea snore loudly and have times when they gasp and stop breathing for 10 seconds or more during sleep. This happens over and over during the night. This disrupts your sleep and keeps your body from getting the rest that it needs, which can cause tiredness and lack of energy (fatigue) during the day. The breaks in breathing also interrupt the deep sleep that you need to feel rested. Even if you do not completely wake up from the gaps in breathing, your sleep may not be restful.  You may also have a headache in the morning and low energy during the day, and you may feel anxious or depressed. How can sleep apnea affect me? Sleep apnea increases your chances of extreme tiredness during the day (daytime fatigue). It can also increase your risk for health conditions, such as:  Heart attack.  Stroke.  Diabetes.  Heart failure.  Irregular heartbeat.  High blood pressure. If you have daytime fatigue as a result of sleep apnea, you may be more likely to:  Perform poorly at school or work.  Fall asleep while driving.  Have difficulty with attention.  Develop depression or anxiety.  Become severely overweight (obese).  Have sexual dysfunction. What actions can I take to manage sleep apnea? Sleep apnea treatment   If you were given a device to  open your airway while you sleep, use it only as told by your health care provider. You may be given: ? An oral appliance. This is a custom-made mouthpiece that shifts your lower jaw forward. ? A continuous positive airway pressure (CPAP) device. This device blows air through a mask when you breathe out (exhale). ? A nasal expiratory positive airway pressure (EPAP) device. This device has valves that you put into each nostril. ? A bi-level positive airway pressure (BPAP) device. This device blows air through a mask when you breathe in (inhale) and breathe out (exhale).  You may need surgery if other treatments do not work for you. Sleep habits  Go to sleep and wake up at the same time every day. This helps set your internal clock (circadian rhythm) for sleeping. ? If you stay up later than usual, such as on weekends, try to get up in the morning within 2 hours of your normal wake time.  Try to get at least 7-9 hours of sleep each night.  Stop computer, tablet, and mobile phone use a few hours before bedtime.  Do not take long naps during the day. If you nap, limit it to 30 minutes.  Have a relaxing bedtime  routine. Reading or listening to music may relax you and help you sleep.  Use your bedroom only for sleep. ? Keep your television and computer out of your bedroom. ? Keep your bedroom cool, dark, and quiet. ? Use a supportive mattress and pillows.  Follow your health care provider's instructions for other changes to sleep habits. Nutrition  Do not eat heavy meals in the evening.  Do not have caffeine in the later part of the day. The effects of caffeine can last for more than 5 hours.  Follow your health care provider's or dietitian's instructions for any diet changes. Lifestyle      Do not drink alcohol before bedtime. Alcohol can cause you to fall asleep at first, but then it can cause you to wake up in the middle of the night and have trouble getting back to sleep.  Do not use any products that contain nicotine or tobacco, such as cigarettes and e-cigarettes. If you need help quitting, ask your health care provider. Medicines  Take over-the-counter and prescription medicines only as told by your health care provider.  Do not use over-the-counter sleep medicine. You can become dependent on this medicine, and it can make sleep apnea worse.  Do not use medicines, such as sedatives and narcotics, unless told by your health care provider. Activity  Exercise on most days, but avoid exercising in the evening. Exercising near bedtime can interfere with sleeping.  If possible, spend time outside every day. Natural light helps regulate your circadian rhythm. General information  Lose weight if you need to, and maintain a healthy weight.  Keep all follow-up visits as told by your health care provider. This is important.  If you are having surgery, make sure to tell your health care provider that you have sleep apnea. You may need to bring your device with you. Where to find more information Learn more about sleep apnea and daytime fatigue from:  American Sleep Association:  sleepassociation.[place]: sleepfoundation.org  National Heart, Lung, and Blood Institute: https://www.hartman-hill.biz/ Summary  Sleep apnea can cause daytime fatigue and other serious health conditions.  Both sleep apnea and daytime fatigue can be bad for your health and well-being.  You may need to wear a device while sleeping to  help keep your airway open.  If you are having surgery, make sure to tell your health care provider that you have sleep apnea. You may need to bring your device with you.  Making changes to sleep habits, diet, lifestyle, and activity can help you manage sleep apnea. This information is not intended to replace advice given to you by your health care provider. Make sure you discuss any questions you have with your health care provider. Document Released: 08/04/2017 Document Revised: 01/10/2018 Document Reviewed: 08/04/2017 Elsevier Interactive Patient Education  2019 [place].    CPAP and BPAP Information CPAP and BPAP are methods of helping a person breathe with the use of air pressure. CPAP stands for "continuous positive airway pressure." BPAP stands for "bi-level positive airway pressure." In both methods, air is blown through your nose or mouth and into your air passages to help you breathe well. CPAP and BPAP use different amounts of pressure to blow air. With CPAP, the amount of pressure stays the same while you breathe in and out. With BPAP, the amount of pressure is increased when you breathe in (inhale) so that you can take larger breaths. Your health care provider will recommend whether CPAP or BPAP would be more helpful for you. Why are CPAP and BPAP treatments used? CPAP or BPAP can be helpful if you have:  Sleep apnea.  Chronic obstructive pulmonary disease (COPD).  Heart failure.  Medical conditions that weaken the muscles of the chest including muscular dystrophy, or neurological diseases such as amyotrophic lateral sclerosis  (ALS).  Other problems that cause breathing to be weak, abnormal, or difficult. CPAP is most commonly used for obstructive sleep apnea (OSA) to keep the airways from collapsing when the muscles relax during sleep. How is CPAP or BPAP administered? Both CPAP and BPAP are provided by a small machine with a flexible plastic tube that attaches to a plastic mask. You wear the mask. Air is blown through the mask into your nose or mouth. The amount of pressure that is used to blow the air can be adjusted on the machine. Your health care provider will determine the pressure setting that should be used based on your individual needs. When should CPAP or BPAP be used? In most cases, the mask only needs to be worn during sleep. Generally, the mask needs to be worn throughout the night and during any daytime naps. People with certain medical conditions may also need to wear the mask at other times when they are awake. Follow instructions from your health care provider about when to use the machine. What are some tips for using the mask?   Because the mask needs to be snug, some people feel trapped or closed-in (claustrophobic) when first using the mask. If you feel this way, you may need to get used to the mask. One way to do this is by holding the mask loosely over your nose or mouth and then gradually applying the mask more snugly. You can also gradually increase the amount of time that you use the mask.  Masks are available in various types and sizes. Some fit over your mouth and nose while others fit over just your nose. If your mask does not fit well, talk with your health care provider about getting a different one.  If you are using a mask that fits over your nose and you tend to breathe through your mouth, a chin strap may be applied to help keep your mouth closed.  The CPAP  and BPAP machines have alarms that may sound if the mask comes off or develops a leak.  If you have trouble with the mask, it is  very important that you talk with your health care provider about finding a way to make the mask easier to tolerate. Do not stop using the mask. Stopping the use of the mask could have a negative impact on your health. What are some tips for using the machine?  Place your CPAP or BPAP machine on a secure table or stand near an electrical outlet.  Know where the on/off switch is located on the machine.  Follow instructions from your health care provider about how to set the pressure on your machine and when you should use it.  Do not eat or drink while the CPAP or BPAP machine is on. Food or fluids could get pushed into your lungs by the pressure of the CPAP or BPAP.  Do not smoke. Tobacco smoke residue can damage the machine.  For home use, CPAP and BPAP machines can be rented or purchased through home health care companies. Many different brands of machines are available. Renting a machine before purchasing may help you find out which particular machine works well for you.  Keep the CPAP or BPAP machine and attachments clean. Ask your health care provider for specific instructions. Get help right away if:  You have redness or open areas around your nose or mouth where the mask fits.  You have trouble using the CPAP or BPAP machine.  You cannot tolerate wearing the CPAP or BPAP mask.  You have pain, discomfort, and bloating in your abdomen. Summary  CPAP and BPAP are methods of helping a person breathe with the use of air pressure.  Both CPAP and BPAP are provided by a small machine with a flexible plastic tube that attaches to a plastic mask.  If you have trouble with the mask, it is very important that you talk with your health care provider about finding a way to make the mask easier to tolerate. This information is not intended to replace advice given to you by your health care provider. Make sure you discuss any questions you have with your health care provider. Document  Released: 02/06/2004 Document Revised: 01/10/2018 Document Reviewed: 03/29/2016 Elsevier Interactive Patient Education  2019 [employer].

## 2018-08-29 NOTE — Assessment & Plan Note (Signed)
Assessment: Severe obstructive sleep apnea in January/2018 home sleep study AHI 78.9 CPAP compliance report today shows excellent compliance  Plan: Continue CPAP as prescribed We will change CPAP settings to APAP 5-15 based off of patient request We will request a 43-month download to check compliance Follow-up in 1 year

## 2018-08-30 ENCOUNTER — Ambulatory Visit: Payer: 59 | Admitting: Pulmonary Disease

## 2018-12-22 NOTE — Progress Notes (Signed)
Reviewed and agree with assessment/plan.   Yalonda Sample, MD [facility] 05/19/2016, 12:24 PM Pager:  [phone]  

## 2019-05-04 ENCOUNTER — Other Ambulatory Visit: Payer: Self-pay | Admitting: Family Medicine

## 2019-05-07 ENCOUNTER — Other Ambulatory Visit: Payer: Self-pay | Admitting: Family Medicine

## 2019-05-07 NOTE — Telephone Encounter (Signed)
Medication: amLODipine (NORVASC) 10 MG tablet [id number]   Has the patient contacted their pharmacy? Yes  (Agent: If no, request that the patient contact the pharmacy for the refill.) (Agent: If yes, when and what did the pharmacy advise?)  Preferred Pharmacy (with phone number or street name): [id number] - [place], [place] - [address] [place] [address]  Phone:  [phone] Fax:  [phone]     Agent: Please be advised that RX refills may take up to 3 business days. We ask that you follow-up with your pharmacy.

## 2019-05-08 ENCOUNTER — Encounter: Payer: Self-pay | Admitting: Family Medicine

## 2019-05-08 ENCOUNTER — Telehealth (INDEPENDENT_AMBULATORY_CARE_PROVIDER_SITE_OTHER): Payer: 59 | Admitting: Family Medicine

## 2019-05-08 ENCOUNTER — Other Ambulatory Visit: Payer: Self-pay

## 2019-05-08 DIAGNOSIS — I10 Essential (primary) hypertension: Secondary | ICD-10-CM

## 2019-05-08 MED ORDER — LISINOPRIL 40 MG PO TABS: 40.0000 mg | ORAL_TABLET | Freq: Every day | ORAL | 3 refills | Status: DC

## 2019-05-08 MED ORDER — AMLODIPINE BESYLATE 10 MG PO TABS: 10.0000 mg | ORAL_TABLET | Freq: Every day | ORAL | 3 refills | Status: DC

## 2019-05-08 NOTE — Progress Notes (Signed)
Virtual Visit via Telephone Note  I connected with the patient on 05/08/19 at  4:15 PM EST by telephone and verified that I am speaking with the correct person using two identifiers. We attempted to connect virtually but we had technical difficulties with the audio and video.     I discussed the limitations, risks, security and privacy concerns of performing an evaluation and management service by telephone and the availability of in person appointments. I also discussed with the patient that there may be a patient responsible charge related to this service. The patient expressed understanding and agreed to proceed.  Location patient: home Location provider: work or home office Participants present for the call: patient, provider Patient did not have a visit in the prior 7 days to address this/these issue(s).   History of Present Illness: Here for medication refills. He feels great and his BP has been stable. His BP this morning was 123/82.    Observations/Objective: Patient sounds cheerful and well on the phone. I do not appreciate any SOB. Speech and thought processing are grossly intact. Patient reported vitals:  Assessment and Plan: HTN, stable. His meds were refilled. I advised him to come in soon for a physical exam and lab work, and he agreed.  Alysia Penna, MD   Follow Up Instructions:     [phone] 5-10 [phone] 11-20 [phone] 21-30 I did not refer this patient for an OV in the next 24 hours for this/these issue(s).  I discussed the assessment and treatment plan with the patient. The patient was provided an opportunity to ask questions and all were answered. The patient agreed with the plan and demonstrated an understanding of the instructions.   The patient was advised to call back or seek an in-person evaluation if the symptoms worsen or if the condition fails to improve as anticipated.  I provided 10 minutes of non-face-to-face time during this encounter.   Alysia Penna, MD

## 2019-07-03 ENCOUNTER — Other Ambulatory Visit: Payer: Self-pay

## 2019-07-03 ENCOUNTER — Ambulatory Visit (INDEPENDENT_AMBULATORY_CARE_PROVIDER_SITE_OTHER): Payer: 59 | Admitting: Adult Health

## 2019-07-03 ENCOUNTER — Encounter: Payer: Self-pay | Admitting: Adult Health

## 2019-07-03 DIAGNOSIS — G4733 Obstructive sleep apnea (adult) (pediatric): Secondary | ICD-10-CM | POA: Diagnosis not present

## 2019-07-03 NOTE — Progress Notes (Signed)
@Patient  ID: Stephen Tanner, male    DOB: 12-14-1970, 49 y.o.   MRN: [id number]  Chief Complaint  Patient presents with  . Follow-up    OSA     Referring provider: Laurey Morale, MD  HPI: 49 year old male followed for obstructive sleep apnea on nocturnal CPAP  TEST/EVENTS :  HST 06/20/17 >> AHI 78.9, SaO2 low 49%  07/03/2019 Follow up : OSA  Patient presents for a 1 year follow-up for sleep apnea.  Patient has known severe obstructive sleep apnea is on nocturnal CPAP-auto setting at 5 to 15 cm H2O.  Says he is doing well on CPAP.  He says he never misses a night, he feels rested with no significant daytime sleepiness and feels that he benefits from CPAP.  Download shows excellent compliance with 100% usage.  Daily average use is at 7 hours.  AHI 0.3.  Average daily pressure 11.2 cm H2O.  Patient does complain that he has been having difficulty with his homecare company [employer] with billing and with getting supplies.  He would like to switch homecare companies.  Allergies  Allergen Reactions  . Lipitor [Atorvastatin]     Fatigue   . Penicillins   . Prednisone     Unwanted side effects lethargic  . Sulfa Antibiotics      There is no immunization history on file for this patient.  Past Medical History:  Diagnosis Date  . Colon cancer [facility])    rectal adenocarcinoma, sees Dr. Burney Gauze   . Hypertension   . Migraines     Tobacco History: Social History   Tobacco Use  Smoking Status Former Smoker  . Types: Cigarettes  . Quit date: 04/13/2011  . Years since quitting: 8.2  Smokeless Tobacco Never Used   Counseling given: Not Answered   Outpatient Medications Prior to Visit  Medication Sig Dispense Refill  . amLODipine (NORVASC) 10 MG tablet Take 1 tablet (10 mg total) by mouth daily. 90 tablet 3  . lisinopril (ZESTRIL) 40 MG tablet Take 1 tablet (40 mg total) by mouth daily. 90 tablet 3   No facility-administered medications prior to visit.     Review  of Systems:   Constitutional:   No  weight loss, night sweats,  Fevers, chills, fatigue, or  lassitude.  HEENT:   No headaches,  Difficulty swallowing,  Tooth/dental problems, or  Sore throat,                No sneezing, itching, ear ache, nasal congestion, post nasal drip,   CV:  No chest pain,  Orthopnea, PND, swelling in lower extremities, anasarca, dizziness, palpitations, syncope.   GI  No heartburn, indigestion, abdominal pain, nausea, vomiting, diarrhea, change in bowel habits, loss of appetite, bloody stools.   Resp: No shortness of breath with exertion or at rest.  No excess mucus, no productive cough,  No non-productive cough,  No coughing up of blood.  No change in color of mucus.  No wheezing.  No chest wall deformity  Skin: no rash or lesions.  GU: no dysuria, change in color of urine, no urgency or frequency.  No flank pain, no hematuria   MS:  No joint pain or swelling.  No decreased range of motion.  No back pain.    Physical Exam  BP 138/84 (BP Location: Left Arm, Patient Position: Sitting, Cuff Size: Normal)   Pulse 89   Temp (!) 97.1 F (36.2 C) (Temporal)   Ht 6' (1.829 m)  Wt 226 lb 12.8 oz (102.9 kg)   SpO2 95% Comment: on RA  BMI 30.76 kg/m   GEN: A/Ox3; pleasant , NAD, well nourished    HEENT:  [place]/AT, NOSE-clear,   NECK:  Supple w/ fair ROM; no JVD;    RESP  Clear  P & A; w/o, wheezes/ rales/ or rhonchi. no accessory muscle use, no dullness to percussion  CARD:  RRR, no m/r/g, no peripheral edema, pulses intact, no cyanosis or clubbing.  GI:   Soft & nt; nml bowel sounds; no organomegaly or masses detected.   Musco: Warm bil, no deformities or joint swelling noted.   Neuro: alert, no focal deficits noted.    Skin: Warm, no lesions or rashes    Lab Results:  CBC   BNP No results found for: BNP  ProBNP No results found for: PROBNP  Imaging: No results found.    No flowsheet data found.  No results found for:  NITRICOXIDE      Assessment & Plan:   Severe obstructive sleep apnea Excellent control and compliance on nocturnal CPAP  Plan Patient Instructions  Continue on CPAP At bedtime   Keep up good work .  Work on healthy weight.  Do not drive If sleepy.  Follow up with Dr. Halford Chessman  In 1 year and As needed   Change DME company (currently with [employer]) .         Total patient care time 20 minutes  Rexene Edison, NP 07/03/2019

## 2019-07-03 NOTE — Assessment & Plan Note (Signed)
Excellent control and compliance on nocturnal CPAP  Plan Patient Instructions  Continue on CPAP At bedtime   Keep up good work .  Work on healthy weight.  Do not drive If sleepy.  Follow up with Dr. Halford Chessman  In 1 year and As needed   Change DME company (currently with [employer]) .

## 2019-07-03 NOTE — Addendum Note (Signed)
Addended by: Lia Foyer R on: 07/03/2019 09:34 AM   Modules accepted: Orders

## 2019-07-03 NOTE — Progress Notes (Signed)
Reviewed and agree with assessment/plan.   Kashae Carstens, MD [facility] 05/19/2016, 12:24 PM Pager:  [phone]  

## 2019-07-03 NOTE — Patient Instructions (Signed)
Continue on CPAP At bedtime   Keep up good work .  Work on healthy weight.  Do not drive If sleepy.  Follow up with Dr. Halford Chessman  In 1 year and As needed   Change DME company (currently with [employer]) .

## 2019-07-16 ENCOUNTER — Telehealth: Payer: Self-pay | Admitting: Adult Health

## 2019-07-16 DIAGNOSIS — G4733 Obstructive sleep apnea (adult) (pediatric): Secondary | ICD-10-CM

## 2019-07-16 NOTE — Telephone Encounter (Signed)
Spoke with Ivin Booty  She states that the pt has been displeased with their services and they have tried to resolve the issues he had but were unable to do so  She states pt wishes to get his CPAP supplies from another DME  I see that Tammy had also mentioned him needing to change DMEs in his last ov note  Called pt to see if he had a specific DME he wanted to go with and he does not  Will place referral now  Nothing further needed

## 2020-05-09 ENCOUNTER — Other Ambulatory Visit: Payer: Self-pay | Admitting: Family Medicine

## 2020-05-12 ENCOUNTER — Telehealth: Payer: Self-pay | Admitting: Family Medicine

## 2020-05-12 NOTE — Telephone Encounter (Signed)
Would it be ok if we scheduled this patient for a cpe on 05/15/2020?   He is currently out of his blood pressure medication.

## 2020-05-12 NOTE — Telephone Encounter (Signed)
Patient scheduled 05/15/2020

## 2020-05-12 NOTE — Telephone Encounter (Signed)
Please advise - all slots that day are marked virtual.

## 2020-05-15 ENCOUNTER — Encounter: Payer: Self-pay | Admitting: Family Medicine

## 2020-05-15 ENCOUNTER — Ambulatory Visit (INDEPENDENT_AMBULATORY_CARE_PROVIDER_SITE_OTHER): Payer: 59 | Admitting: Family Medicine

## 2020-05-15 ENCOUNTER — Other Ambulatory Visit: Payer: Self-pay

## 2020-05-15 VITALS — BP 128/84 | HR 74 | Temp 98.5°F | Ht 72.0 in | Wt 218.0 lb

## 2020-05-15 DIAGNOSIS — Z0000 Encounter for general adult medical examination without abnormal findings: Secondary | ICD-10-CM | POA: Diagnosis not present

## 2020-05-15 DIAGNOSIS — E119 Type 2 diabetes mellitus without complications: Secondary | ICD-10-CM

## 2020-05-15 DIAGNOSIS — C20 Malignant neoplasm of rectum: Secondary | ICD-10-CM

## 2020-05-15 MED ORDER — GLIPIZIDE 10 MG PO TABS: 10.0000 mg | ORAL_TABLET | Freq: Two times a day (BID) | ORAL | 3 refills | Status: DC

## 2020-05-15 MED ORDER — AMLODIPINE BESYLATE 10 MG PO TABS: 10.0000 mg | ORAL_TABLET | Freq: Every day | ORAL | 3 refills | Status: DC

## 2020-05-15 NOTE — Progress Notes (Signed)
Subjective:    Patient ID: Stephen Tanner, male    DOB: [birth year], 49 y.o.   MRN: [id number]  HPI Here for a well exam. He has felt well in general but lately he has noticed that he is thirsty and he was uriating more often. He had not checked his glucoses for years, but one week ago he did so and it was 367. Now in the last week it has averaged from 200-250. He tried taking Metformin several years ago when the diabetes was first diagnosed, but he could not tolerate it due to cramps and diarrhea. At that time he was able to get it under control with diet alone. His BP has been stable.    Review of Systems  Constitutional: Negative.   HENT: Negative.   Eyes: Negative.   Respiratory: Negative.   Cardiovascular: Negative.   Gastrointestinal: Negative.   Genitourinary: Positive for frequency.  Musculoskeletal: Negative.   Skin: Negative.   Neurological: Negative.   Psychiatric/Behavioral: Negative.        Objective:   Physical Exam Constitutional:      General: He is not in acute distress.    Appearance: Normal appearance. He is well-developed and well-nourished. He is not diaphoretic.  HENT:     Head: Normocephalic and atraumatic.     Right Ear: External ear normal.     Left Ear: External ear normal.     Nose: Nose normal.     Mouth/Throat:     Mouth: Oropharynx is clear and moist.     Pharynx: No oropharyngeal exudate.  Eyes:     General: No scleral icterus.       Right eye: No discharge.        Left eye: No discharge.     Extraocular Movements: EOM normal.     Conjunctiva/sclera: Conjunctivae normal.     Pupils: Pupils are equal, round, and reactive to light.  Neck:     Thyroid: No thyromegaly.     Vascular: No JVD.     Trachea: No tracheal deviation.  Cardiovascular:     Rate and Rhythm: Normal rate and regular rhythm.     Pulses: Intact distal pulses.     Heart sounds: Normal heart sounds. No murmur heard. No friction rub. No gallop.   Pulmonary:     Effort:  Pulmonary effort is normal. No respiratory distress.     Breath sounds: Normal breath sounds. No wheezing or rales.  Chest:     Chest wall: No tenderness.  Abdominal:     General: Bowel sounds are normal. There is no distension.     Palpations: Abdomen is soft. There is no mass.     Tenderness: There is no abdominal tenderness. There is no guarding or rebound.     Comments: He has an ostomy in the LLQ   Genitourinary:    Penis: Normal. No tenderness.      Testes: Normal.  Musculoskeletal:        General: No tenderness or edema. Normal range of motion.     Cervical back: Neck supple.  Lymphadenopathy:     Cervical: No cervical adenopathy.  Skin:    General: Skin is warm and dry.     Coloration: Skin is not pale.     Findings: No erythema or rash.  Neurological:     Mental Status: He is alert and oriented to person, place, and time.     Cranial Nerves: No cranial nerve deficit.  Motor: No abnormal muscle tone.     Coordination: Coordination normal.     Deep Tendon Reflexes: Reflexes are normal and symmetric. Reflexes normal.  Psychiatric:        Mood and Affect: Mood and affect normal.        Behavior: Behavior normal.        Thought Content: Thought content normal.        Judgment: Judgment normal.           Assessment & Plan:  Well exam. We discussed diet and exercise. Get fasting labs including an A1c. He will start on Glipizide 10 mg BID for the diabetes, and we will follow up with him in 2-3 weeks. I advised him to get an eye exam soon. We will refer him back to Dr. Benson [place] for a colonoscopy.  Alysia Penna, MD

## 2020-05-16 LAB — BASIC METABOLIC PANEL WITH GFR
BUN: 20 mg/dL (ref 7–25)
CO2: 28 mmol/L (ref 20–32)
Calcium: 9.7 mg/dL (ref 8.6–10.3)
Chloride: 100 mmol/L (ref 98–110)
Creat: 0.87 mg/dL (ref 0.60–1.35)
GFR, Est African American: 117 mL/min/{1.73_m2} (ref >=60)
GFR, Est Non African American: 101 mL/min/{1.73_m2} (ref >=60)
Glucose, Bld: 225 mg/dL — ABNORMAL HIGH (ref 65–99)
Potassium: 4.6 mmol/L (ref 3.5–5.3)
Sodium: 137 mmol/L (ref 135–146)

## 2020-05-16 LAB — HEPATIC FUNCTION PANEL
AG Ratio: 2.2 (calc) (ref 1.0–2.5)
ALT: 65 U/L — ABNORMAL HIGH (ref 9–46)
AST: 36 U/L (ref 10–40)
Albumin: 4.7 g/dL (ref 3.6–5.1)
Alkaline phosphatase (APISO): 49 U/L (ref 36–130)
Bilirubin, Direct: 0.1 mg/dL (ref 0.0–0.2)
Globulin: 2.1 g/dL (calc) (ref 1.9–3.7)
Indirect Bilirubin: 0.6 mg/dL (calc) (ref 0.2–1.2)
Total Bilirubin: 0.7 mg/dL (ref 0.2–1.2)
Total Protein: 6.8 g/dL (ref 6.1–8.1)

## 2020-05-16 LAB — CBC WITH DIFFERENTIAL/PLATELET
Absolute Monocytes: 439 cells/uL (ref 200–950)
Basophils Absolute: 41 cells/uL (ref 0–200)
Basophils Relative: 0.8 %
Eosinophils Absolute: 112 cells/uL (ref 15–500)
Eosinophils Relative: 2.2 %
HCT: 51.2 % — ABNORMAL HIGH (ref 38.5–50.0)
Hemoglobin: 18 g/dL — ABNORMAL HIGH (ref 13.2–17.1)
Lymphs Abs: 2275 cells/uL (ref 850–3900)
MCH: 32.5 pg (ref 27.0–33.0)
MCHC: 35.2 g/dL (ref 32.0–36.0)
MCV: 92.4 fL (ref 80.0–100.0)
MPV: 9.4 fL (ref 7.5–12.5)
Monocytes Relative: 8.6 %
Neutro Abs: 2234 cells/uL (ref 1500–7800)
Neutrophils Relative %: 43.8 %
Platelets: 192 10*3/uL (ref 140–400)
RBC: 5.54 10*6/uL (ref 4.20–5.80)
RDW: 12.5 % (ref 11.0–15.0)
Total Lymphocyte: 44.6 %
WBC: 5.1 10*3/uL (ref 3.8–10.8)

## 2020-05-16 LAB — HEMOGLOBIN A1C
Hgb A1c MFr Bld: 10.6 % of total Hgb — ABNORMAL HIGH (ref <5.7)
Mean Plasma Glucose: 258 mg/dL
eAG (mmol/L): 14.3 mmol/L

## 2020-05-16 LAB — LIPID PANEL
Cholesterol: 279 mg/dL — ABNORMAL HIGH (ref <200)
HDL: 64 mg/dL (ref >=40)
LDL Cholesterol (Calc): 187 mg/dL (calc) — ABNORMAL HIGH
Non-HDL Cholesterol (Calc): 215 mg/dL (calc) — ABNORMAL HIGH (ref <130)
Total CHOL/HDL Ratio: 4.4 (calc) (ref <5.0)
Triglycerides: 142 mg/dL (ref <150)

## 2020-05-16 LAB — TSH: TSH: 0.8 mIU/L (ref 0.40–4.50)

## 2020-05-19 ENCOUNTER — Telehealth: Payer: Self-pay | Admitting: *Deleted

## 2020-05-19 ENCOUNTER — Ambulatory Visit: Payer: 59 | Admitting: Family Medicine

## 2020-05-19 NOTE — Telephone Encounter (Signed)
Pt stated since start taking Rx Glipizide--sugar reading fasting are 300, 117, 250, and this morning 176. Pt would like to know if this reading level are normal or need to be concern? Please advise.

## 2020-05-20 NOTE — Telephone Encounter (Signed)
These readings are still high, but they are a big improvement from before. Stay on current meds and watch the diet. Follow up with[place] in 3-4 weeks.

## 2020-05-20 NOTE — Telephone Encounter (Signed)
Notified pt message and instructions by Dr. Clent Ridges. Pt made an appt for Jan 11 @ 8:30

## 2020-06-03 ENCOUNTER — Ambulatory Visit: Payer: 59 | Admitting: Family Medicine

## 2020-06-03 ENCOUNTER — Other Ambulatory Visit: Payer: Self-pay

## 2020-06-03 ENCOUNTER — Encounter: Payer: Self-pay | Admitting: Family Medicine

## 2020-06-03 VITALS — BP 104/70 | HR 71 | Temp 98.0°F | Ht 72.0 in | Wt 221.0 lb

## 2020-06-03 DIAGNOSIS — E119 Type 2 diabetes mellitus without complications: Secondary | ICD-10-CM | POA: Diagnosis not present

## 2020-06-03 MED ORDER — SITAGLIPTIN PHOSPHATE 50 MG PO TABS: 50.0000 mg | ORAL_TABLET | Freq: Every day | ORAL | 5 refills | Status: DC

## 2020-06-03 NOTE — Progress Notes (Signed)
   Subjective:    Patient ID: Stephen Tanner, male    DOB: 07-25-1970, 50 y.o.   MRN: [id number]  HPI Here to follow up on diabetes. At his well exam on 05-15-20 his A1c was 10.6. At that time he began taking Glipizide 10 mg BID and he changed his diet. He feels better now. His glucoses have been in the range of 100-200.   Review of Systems  Constitutional: Negative.   Respiratory: Negative.   Cardiovascular: Negative.        Objective:   Physical Exam Constitutional:      Appearance: Normal appearance.  Cardiovascular:     Rate and Rhythm: Normal rate and regular rhythm.     Pulses: Normal pulses.     Heart sounds: Normal heart sounds.  Pulmonary:     Effort: Pulmonary effort is normal.     Breath sounds: Normal breath sounds.  Neurological:     Mental Status: He is alert.           Assessment & Plan:  Diabetes, improving. We will add Januvia 50 mg daily. Recheck in 2 months.  Alysia Penna, MD

## 2020-06-04 ENCOUNTER — Encounter: Payer: Self-pay | Admitting: Family Medicine

## 2020-06-04 NOTE — Telephone Encounter (Signed)
Stephen Tanner called and spoke to Stephen Tanner about the patient saying Stephen Tanner will cost him $548. She says if he has it, he should present it to them, because they only have Stephen Tanner on file and it will cost the patient $687.99 without a PA from the provider. She also says the insurance indicated send in alternative to Stephen Tanner. I called the patient and advised of the above. He says if Dr. Sarajane Tanner would send in the alternative so that he doesn't have to go through getting a PA every year. I advised I will send and someone will message him what Dr. Sarajane Tanner recommends.

## 2020-06-05 NOTE — Telephone Encounter (Signed)
Instead of me guessing over and over, have the patient contact his insurance company to get a list of what meds they WILL cover

## 2020-06-10 NOTE — Telephone Encounter (Signed)
Have him check with his insurance about Farxiga, Jardiance, Invokana, Tradjenta, or Onglyza

## 2020-06-11 ENCOUNTER — Other Ambulatory Visit: Payer: Self-pay

## 2020-06-11 MED ORDER — SITAGLIPTIN PHOSPHATE 50 MG PO TABS: 50.0000 mg | ORAL_TABLET | Freq: Every day | ORAL | 5 refills | Status: DC

## 2020-06-17 ENCOUNTER — Other Ambulatory Visit: Payer: Self-pay | Admitting: Family Medicine

## 2020-06-18 ENCOUNTER — Telehealth: Payer: Self-pay

## 2020-06-18 NOTE — Telephone Encounter (Signed)
Prior authorization was denied for Januvia 50 mg tabs.  Can a script for Tradjenta be submitted to the pharmacy in place of the [facility]?

## 2020-06-19 ENCOUNTER — Other Ambulatory Visit: Payer: Self-pay

## 2020-06-19 MED ORDER — LINAGLIPTIN 5 MG PO TABS: 5.0000 mg | ORAL_TABLET | Freq: Every day | ORAL | 3 refills | Status: DC

## 2020-06-19 NOTE — Telephone Encounter (Signed)
Cancel the Januvia. Instead call in Tradjenta 5 mg to take daily, #30 with 3 rf

## 2020-06-19 NOTE — Telephone Encounter (Signed)
Cancelled Januvia 50mg , sent in Tradjent 5mg  to [facility] in [place]

## 2020-07-15 ENCOUNTER — Encounter: Payer: Self-pay | Admitting: Pulmonary Disease

## 2020-07-15 ENCOUNTER — Other Ambulatory Visit: Payer: Self-pay

## 2020-07-15 ENCOUNTER — Ambulatory Visit: Payer: 59 | Admitting: Pulmonary Disease

## 2020-07-15 VITALS — BP 132/80 | HR 72 | Temp 97.8°F | Ht 72.0 in | Wt 222.2 lb

## 2020-07-15 DIAGNOSIS — G4733 Obstructive sleep apnea (adult) (pediatric): Secondary | ICD-10-CM | POA: Diagnosis not present

## 2020-07-15 DIAGNOSIS — Z9989 Dependence on other enabling machines and devices: Secondary | ICD-10-CM | POA: Diagnosis not present

## 2020-07-15 NOTE — Progress Notes (Signed)
   [facility]  Chief Complaint  Patient presents with  . Follow-up    No complaints currently    Constitutional:  BP 132/80 (BP Location: Right Arm, Cuff Size: Normal)   Pulse 72   Temp 97.8 F (36.6 C) (Temporal)   Ht 6' (1.829 m)   Wt 222 lb 3.2 oz (100.8 kg)   SpO2 97% Comment: Room air  BMI 30.14 kg/m   Past Medical History:  Colon cancer, DM, HTN, Migraine HA  Past Surgical History:  He  has a past surgical history that includes Appendectomy; Tonsillectomy and adenoidectomy; Tympanostomy tube placement; and Colon surgery (04-19-09).  Brief Summary:  Stephen Tanner is a 50 y.o. male former smoker with obstructive sleep apnea.      Subjective:   Uses CPAP nightly.  Has full face mask.  No issues with mask fit, dry mouth, sore throat, or aerophagia.  Physical Exam:   Appearance - well kempt   ENMT - no sinus tenderness, no oral exudate, no LAN, Mallampati 2 airway, no stridor, high arched palate  Respiratory - equal breath sounds bilaterally, no wheezing or rales  CV - s1s2 regular rate and rhythm, no murmurs  Ext - no clubbing, no edema  Skin - no rashes  Psych - normal mood and affect   Sleep Tests:   HST 06/20/17 >> AHI 78.9, SaO2 low 49%  Auto CPAP 06/14/20 to 07/13/20 >> used on 30 of 30 nights with average 7 hrs 26 min.  Average AHI 0.8 with median CPAP 9 and 95 th percentile CPAP 13 cm H2O  Social History:  He  reports that he quit smoking about 9 years ago. His smoking use included cigarettes. He has never used smokeless tobacco. He reports current alcohol use of about 1.0 standard drink of alcohol per week. He reports current drug use. Drugs: "Crack" cocaine, Heroin, and Marijuana.  Family History:  His family history includes Hypertension in his father and mother.     Assessment/Plan:   Obstructive sleep apnea. - he is compliant with therapy and reports benefit - he uses [employer] for his  DME - continue auto CPAP 5 to 15 cm H2O  Time Spent Involved in Patient Care on Day of Examination:  14 minutes  Follow up:  Patient Instructions  Follow up in 1 year   Medication List:   Allergies as of 07/15/2020      Reactions   Lipitor [atorvastatin]    Fatigue   Penicillins    Prednisone    Unwanted side effects lethargic   Sulfa Antibiotics       Medication List       Accurate as of July 15, 2020 10:14 AM. If you have any questions, ask your nurse or doctor.        amLODipine 10 MG tablet Commonly known as: NORVASC TAKE 1 TABLET(10 MG) BY MOUTH DAILY   glipiZIDE 10 MG tablet Commonly known as: GLUCOTROL Take 1 tablet (10 mg total) by mouth 2 (two) times daily before a meal.   linagliptin 5 MG Tabs tablet Commonly known as: Tradjenta Take 1 tablet (5 mg total) by mouth daily.   lisinopril 40 MG tablet Commonly known as: ZESTRIL TAKE 1 TABLET(40 MG) BY MOUTH DAILY       Signature:  Chesley Mires, MD [place] Pager - [phone] 07/15/2020, 10:14 AM

## 2020-07-15 NOTE — Patient Instructions (Signed)
Follow up in 1 year.

## 2020-09-09 ENCOUNTER — Other Ambulatory Visit: Payer: Self-pay | Admitting: Family Medicine

## 2020-09-09 ENCOUNTER — Telehealth: Payer: Self-pay | Admitting: Family Medicine

## 2020-09-09 NOTE — Telephone Encounter (Signed)
Pt wife call and stated he is completely out ofglipiZIDE (GLUCOTROL) 10 MG tablet and need a refill sent to  [facility], [place] - [address] Phone:  [phone]  Fax:  [phone]    Today.wife want a call back when done.

## 2020-09-09 NOTE — Telephone Encounter (Signed)
LVM stating medication has already been called in to pharmacy.

## 2020-09-09 NOTE — Telephone Encounter (Signed)
Patient wife is calling back again regarding medication refill. Pt wife stated that patient is out of medication and is requesting a call back when medication has be sent, please advise CB is [phone]

## 2021-01-29 ENCOUNTER — Other Ambulatory Visit: Payer: Self-pay | Admitting: Family Medicine

## 2021-03-30 ENCOUNTER — Other Ambulatory Visit: Payer: Self-pay | Admitting: Family Medicine

## 2021-05-06 ENCOUNTER — Other Ambulatory Visit: Payer: Self-pay | Admitting: Family Medicine

## 2021-06-28 ENCOUNTER — Other Ambulatory Visit: Payer: Self-pay | Admitting: Family Medicine

## 2021-06-29 NOTE — Telephone Encounter (Signed)
Pt need appointment for further refills 

## 2021-07-16 ENCOUNTER — Other Ambulatory Visit: Payer: Self-pay

## 2021-07-16 ENCOUNTER — Encounter: Payer: Self-pay | Admitting: Pulmonary Disease

## 2021-07-16 ENCOUNTER — Ambulatory Visit: Payer: 59 | Admitting: Pulmonary Disease

## 2021-07-16 VITALS — BP 152/94 | HR 95 | Temp 98.0°F | Ht 72.0 in | Wt 227.8 lb

## 2021-07-16 DIAGNOSIS — Z9989 Dependence on other enabling machines and devices: Secondary | ICD-10-CM

## 2021-07-16 DIAGNOSIS — G4733 Obstructive sleep apnea (adult) (pediatric): Secondary | ICD-10-CM | POA: Diagnosis not present

## 2021-07-16 NOTE — Progress Notes (Signed)
[facility]  Chief Complaint  Patient presents with   Follow-up    Follow up. Patient has no complaints.    Constitutional:  BP (!) 152/94 (BP Location: Right Arm, Patient Position: Sitting, Cuff Size: Normal)    Pulse 95    Temp 98 F (36.7 C) (Oral)    Ht 6' (1.829 m)    Wt 227 lb 12.8 oz (103.3 kg)    SpO2 96%    BMI 30.90 kg/m   Past Medical History:  Colon cancer, DM, HTN, Migraine HA  Past Surgical History:  He  has a past surgical history that includes Appendectomy; Tonsillectomy and adenoidectomy; Tympanostomy tube placement; and Colon surgery (04-19-09).  Brief Summary:  Stephen Tanner is a 51 y.o. male former smoker with obstructive sleep apnea.      Subjective:   He uses CPAP nightly.  Not having sinus congestion or dry mouth.  Using full face mask.  He would like to use a smaller mask, but last time he did he developed mouth leak.  Sleeps through the night.  Feels rested during the day.  Physical Exam:   Appearance - well kempt   ENMT - no sinus tenderness, no oral exudate, no LAN, Mallampati 2 airway, no stridor, high arched palate  Respiratory - equal breath sounds bilaterally, no wheezing or rales  CV - s1s2 regular rate and rhythm, no murmurs  Ext - no clubbing, no edema  Skin - no rashes  Psych - normal mood and affect    Sleep Tests:  HST 06/20/17 >> AHI 78.9, SaO2 low 49% Auto CPAP 06/15/21 to 07/14/21 >> used on 30 of 30 nights with average 7 hrs 14 min.  Average AHI 0.9 with median CPAP 10 and 95 th percentile CPAP 14 cm H2O  Social History:  He  reports that he quit smoking about 10 years ago. His smoking use included cigarettes. He has never used smokeless tobacco. He reports current alcohol use of about 1.0 standard drink per week. He reports current drug use. Drugs: "Crack" cocaine, Heroin, and Marijuana.  Family History:  His family history includes Hypertension in his father and mother.      Assessment/Plan:   Obstructive sleep apnea. - he is compliant with therapy and reports benefit - he uses [facility] for his DME - continue auto CPAP 5 to 15 cm H2O - he can look up mask options on-line and call if he finds a mask he would like to switch to  Time Spent Involved in Patient Care on Day of Examination:  15 minutes  Follow up:   Patient Instructions  Follow up in 1 year  Medication List:   Allergies as of 07/16/2021       Reactions   Lipitor [atorvastatin]    Fatigue   Penicillins    Prednisone    Unwanted side effects lethargic   Sulfa Antibiotics         Medication List        Accurate as of July 16, 2021  4:40 PM. If you have any questions, ask your nurse or doctor.          amLODipine 10 MG tablet Commonly known as: NORVASC TAKE 1 TABLET(10 MG) BY MOUTH DAILY   glipiZIDE 10 MG tablet Commonly known as: GLUCOTROL TAKE 1 TABLET(10 MG) BY MOUTH TWICE DAILY BEFORE A MEAL   linagliptin 5 MG Tabs tablet Commonly known as: Tradjenta Take 1 tablet (5 mg total)  by mouth daily.   lisinopril 40 MG tablet Commonly known as: ZESTRIL TAKE 1 TABLET(40 MG) BY MOUTH DAILY        Signature:  Chesley Mires, MD [place] Pager - [phone] 07/16/2021, 4:40 PM

## 2021-07-16 NOTE — Patient Instructions (Signed)
Follow up in 1 year.

## 2021-08-21 ENCOUNTER — Ambulatory Visit (INDEPENDENT_AMBULATORY_CARE_PROVIDER_SITE_OTHER): Payer: 59 | Admitting: Family Medicine

## 2021-08-21 ENCOUNTER — Encounter: Payer: Self-pay | Admitting: Family Medicine

## 2021-08-21 VITALS — BP 128/84 | HR 78 | Temp 98.9°F | Ht 72.0 in | Wt 227.0 lb

## 2021-08-21 DIAGNOSIS — Z23 Encounter for immunization: Secondary | ICD-10-CM | POA: Diagnosis not present

## 2021-08-21 DIAGNOSIS — Z0000 Encounter for general adult medical examination without abnormal findings: Secondary | ICD-10-CM | POA: Diagnosis not present

## 2021-08-21 LAB — CBC WITH DIFFERENTIAL/PLATELET
Basophils Absolute: 0 10*3/uL (ref 0.0–0.1)
Basophils Relative: 0.6 % (ref 0.0–3.0)
Eosinophils Absolute: 0.1 10*3/uL (ref 0.0–0.7)
Eosinophils Relative: 1.1 % (ref 0.0–5.0)
HCT: 47.3 % (ref 39.0–52.0)
Hemoglobin: 16.2 g/dL (ref 13.0–17.0)
Lymphocytes Relative: 37.1 % (ref 12.0–46.0)
Lymphs Abs: 2.5 10*3/uL (ref 0.7–4.0)
MCHC: 34.3 g/dL (ref 30.0–36.0)
MCV: 92.9 fl (ref 78.0–100.0)
Monocytes Absolute: 0.5 10*3/uL (ref 0.1–1.0)
Monocytes Relative: 7.4 % (ref 3.0–12.0)
Neutro Abs: 3.6 10*3/uL (ref 1.4–7.7)
Neutrophils Relative %: 53.8 % (ref 43.0–77.0)
Platelets: 198 10*3/uL (ref 150.0–400.0)
RBC: 5.08 Mil/uL (ref 4.22–5.81)
RDW: 12.8 % (ref 11.5–15.5)
WBC: 6.7 10*3/uL (ref 4.0–10.5)

## 2021-08-21 LAB — BASIC METABOLIC PANEL
BUN: 24 mg/dL — ABNORMAL HIGH (ref 6–23)
CO2: 28 mEq/L (ref 19–32)
Calcium: 10 mg/dL (ref 8.4–10.5)
Chloride: 103 mEq/L (ref 96–112)
Creatinine, Ser: 0.95 mg/dL (ref 0.40–1.50)
GFR: 93.24 mL/min (ref >60.00)
Glucose, Bld: 83 mg/dL (ref 70–99)
Potassium: 4.3 mEq/L (ref 3.5–5.1)
Sodium: 138 mEq/L (ref 135–145)

## 2021-08-21 LAB — LIPID PANEL
Cholesterol: 260 mg/dL — ABNORMAL HIGH (ref 0–200)
HDL: 66.1 mg/dL (ref >39.00)
LDL Cholesterol: 171 mg/dL — ABNORMAL HIGH (ref 0–99)
NonHDL: 193.71
Total CHOL/HDL Ratio: 4
Triglycerides: 115 mg/dL (ref 0.0–149.0)
VLDL: 23 mg/dL (ref 0.0–40.0)

## 2021-08-21 LAB — TSH: TSH: 0.86 u[IU]/mL (ref 0.35–5.50)

## 2021-08-21 LAB — PSA: PSA: 1.78 ng/mL (ref 0.10–4.00)

## 2021-08-21 LAB — HEMOGLOBIN A1C: Hgb A1c MFr Bld: 6 % (ref 4.6–6.5)

## 2021-08-21 LAB — HEPATIC FUNCTION PANEL
ALT: 63 U/L — ABNORMAL HIGH (ref 0–53)
AST: 30 U/L (ref 0–37)
Albumin: 4.8 g/dL (ref 3.5–5.2)
Alkaline Phosphatase: 45 U/L (ref 39–117)
Bilirubin, Direct: 0.1 mg/dL (ref 0.0–0.3)
Total Bilirubin: 0.5 mg/dL (ref 0.2–1.2)
Total Protein: 7.1 g/dL (ref 6.0–8.3)

## 2021-08-21 MED ORDER — LISINOPRIL 40 MG PO TABS: ORAL_TABLET | ORAL | 3 refills | Status: DC

## 2021-08-21 MED ORDER — GLIPIZIDE 10 MG PO TABS
10.0000 mg | ORAL_TABLET | Freq: Every day | ORAL | 3 refills | Status: DC
Start: 2021-08-21 — End: 2022-08-27

## 2021-08-21 MED ORDER — AMLODIPINE BESYLATE 10 MG PO TABS: ORAL_TABLET | ORAL | 3 refills | Status: DC

## 2021-08-21 NOTE — Progress Notes (Signed)
? ?Subjective:  ? ? Patient ID: Stephen Tanner, male    DOB: 10/04/1970, 51 y.o.   MRN: [id number] ? ?HPI ?Here for a well exam. He feels fine. His am fasting glucoses average 90-100. In the evenings he has frequent drops in the glucoses as low as 47. He has almost completely eliminated carbs from his diet.  ? ? ?Review of Systems  ?Constitutional: Negative.   ?HENT: Negative.    ?Eyes: Negative.   ?Respiratory: Negative.    ?Cardiovascular: Negative.   ?Gastrointestinal: Negative.   ?Genitourinary: Negative.   ?Musculoskeletal: Negative.   ?Skin: Negative.   ?Neurological: Negative.   ?Psychiatric/Behavioral: Negative.    ? ?   ?Objective:  ? Physical Exam ?Constitutional:   ?   General: He is not in acute distress. ?   Appearance: Normal appearance. He is well-developed. He is not diaphoretic.  ?HENT:  ?   Head: Normocephalic and atraumatic.  ?   Right Ear: External ear normal.  ?   Left Ear: External ear normal.  ?   Nose: Nose normal.  ?   Mouth/Throat:  ?   Pharynx: No oropharyngeal exudate.  ?Eyes:  ?   General: No scleral icterus.    ?   Right eye: No discharge.     ?   Left eye: No discharge.  ?   Conjunctiva/sclera: Conjunctivae normal.  ?   Pupils: Pupils are equal, round, and reactive to light.  ?Neck:  ?   Thyroid: No thyromegaly.  ?   Vascular: No JVD.  ?   Trachea: No tracheal deviation.  ?Cardiovascular:  ?   Rate and Rhythm: Normal rate and regular rhythm.  ?   Heart sounds: Normal heart sounds. No murmur heard. ?  No friction rub. No gallop.  ?Pulmonary:  ?   Effort: Pulmonary effort is normal. No respiratory distress.  ?   Breath sounds: Normal breath sounds. No wheezing or rales.  ?Chest:  ?   Chest wall: No tenderness.  ?Abdominal:  ?   General: Bowel sounds are normal. There is no distension.  ?   Palpations: Abdomen is soft. There is no mass.  ?   Tenderness: There is no abdominal tenderness. There is no guarding or rebound.  ?Genitourinary: ?   Penis: Normal. No tenderness.   ?   Testes: Normal.   ?   Comments: He has a LLQ ostomy bag  ?Musculoskeletal:     ?   General: No tenderness. Normal range of motion.  ?   Cervical back: Neck supple.  ?Lymphadenopathy:  ?   Cervical: No cervical adenopathy.  ?Skin: ?   General: Skin is warm and dry.  ?   Coloration: Skin is not pale.  ?   Findings: No erythema or rash.  ?Neurological:  ?   Mental Status: He is alert and oriented to person, place, and time.  ?   Cranial Nerves: No cranial nerve deficit.  ?   Motor: No abnormal muscle tone.  ?   Coordination: Coordination normal.  ?   Deep Tendon Reflexes: Reflexes are normal and symmetric. Reflexes normal.  ?Psychiatric:     ?   Behavior: Behavior normal.     ?   Thought Content: Thought content normal.     ?   Judgment: Judgment normal.  ? ? ? ? ? ?   ?Assessment & Plan:  ?Well exam.cussed diet and exercise. Get fasting labs. For the diabetes, we will stop the evening dose of  Glipizide and take it only in the mornings. Set up a colonoscopy.  ?Alysia Penna, MD ? ? ?

## 2021-08-21 NOTE — Addendum Note (Signed)
Addended by: Wyvonne Lenz on: 08/21/2021 01:59 PM ? ? Modules accepted: Orders ? ?

## 2022-05-12 ENCOUNTER — Other Ambulatory Visit: Payer: Self-pay | Admitting: Family Medicine

## 2022-08-27 ENCOUNTER — Encounter: Payer: Self-pay | Admitting: Family Medicine

## 2022-08-27 ENCOUNTER — Ambulatory Visit (INDEPENDENT_AMBULATORY_CARE_PROVIDER_SITE_OTHER): Payer: 59 | Admitting: Family Medicine

## 2022-08-27 VITALS — BP 128/82 | HR 80 | Temp 98.1°F | Ht 72.0 in | Wt 225.0 lb

## 2022-08-27 DIAGNOSIS — Z0000 Encounter for general adult medical examination without abnormal findings: Secondary | ICD-10-CM

## 2022-08-27 DIAGNOSIS — N138 Other obstructive and reflux uropathy: Secondary | ICD-10-CM | POA: Diagnosis not present

## 2022-08-27 DIAGNOSIS — N401 Enlarged prostate with lower urinary tract symptoms: Secondary | ICD-10-CM

## 2022-08-27 LAB — PSA: PSA: 0.95 ng/mL (ref 0.10–4.00)

## 2022-08-27 LAB — HEPATIC FUNCTION PANEL
ALT: 91 U/L — ABNORMAL HIGH (ref 0–53)
AST: 43 U/L — ABNORMAL HIGH (ref 0–37)
Albumin: 4.6 g/dL (ref 3.5–5.2)
Alkaline Phosphatase: 48 U/L (ref 39–117)
Bilirubin, Direct: 0.1 mg/dL (ref 0.0–0.3)
Total Bilirubin: 0.7 mg/dL (ref 0.2–1.2)
Total Protein: 7.1 g/dL (ref 6.0–8.3)

## 2022-08-27 LAB — LIPID PANEL
Cholesterol: 260 mg/dL — ABNORMAL HIGH (ref 0–200)
HDL: 66.6 mg/dL
LDL Cholesterol: 173 mg/dL — ABNORMAL HIGH (ref 0–99)
NonHDL: 193.44
Total CHOL/HDL Ratio: 4
Triglycerides: 104 mg/dL (ref 0.0–149.0)
VLDL: 20.8 mg/dL (ref 0.0–40.0)

## 2022-08-27 LAB — CBC WITH DIFFERENTIAL/PLATELET
Basophils Absolute: 0 K/uL (ref 0.0–0.1)
Basophils Relative: 0.6 % (ref 0.0–3.0)
Eosinophils Absolute: 0.1 K/uL (ref 0.0–0.7)
Eosinophils Relative: 2.1 % (ref 0.0–5.0)
HCT: 48.5 % (ref 39.0–52.0)
Hemoglobin: 16.8 g/dL (ref 13.0–17.0)
Lymphocytes Relative: 42.2 % (ref 12.0–46.0)
Lymphs Abs: 2.4 K/uL (ref 0.7–4.0)
MCHC: 34.7 g/dL (ref 30.0–36.0)
MCV: 93.6 fl (ref 78.0–100.0)
Monocytes Absolute: 0.5 K/uL (ref 0.1–1.0)
Monocytes Relative: 8.5 % (ref 3.0–12.0)
Neutro Abs: 2.7 K/uL (ref 1.4–7.7)
Neutrophils Relative %: 46.6 % (ref 43.0–77.0)
Platelets: 201 K/uL (ref 150.0–400.0)
RBC: 5.19 Mil/uL (ref 4.22–5.81)
RDW: 13.2 % (ref 11.5–15.5)
WBC: 5.7 K/uL (ref 4.0–10.5)

## 2022-08-27 LAB — BASIC METABOLIC PANEL WITH GFR
BUN: 23 mg/dL (ref 6–23)
CO2: 26 meq/L (ref 19–32)
Calcium: 9.3 mg/dL (ref 8.4–10.5)
Chloride: 101 meq/L (ref 96–112)
Creatinine, Ser: 0.87 mg/dL (ref 0.40–1.50)
GFR: 99.8 mL/min
Glucose, Bld: 125 mg/dL — ABNORMAL HIGH (ref 70–99)
Potassium: 4.2 meq/L (ref 3.5–5.1)
Sodium: 136 meq/L (ref 135–145)

## 2022-08-27 LAB — HEMOGLOBIN A1C: Hgb A1c MFr Bld: 6.2 % (ref 4.6–6.5)

## 2022-08-27 LAB — TSH: TSH: 0.9 u[IU]/mL (ref 0.35–5.50)

## 2022-08-27 MED ORDER — GLIPIZIDE 10 MG PO TABS: 5.0000 mg | ORAL_TABLET | Freq: Every day | ORAL | 3 refills | Status: DC

## 2022-08-27 MED ORDER — LISINOPRIL 40 MG PO TABS: 40.0000 mg | ORAL_TABLET | Freq: Every day | ORAL | 3 refills | Status: DC

## 2022-08-27 MED ORDER — TAMSULOSIN HCL 0.4 MG PO CAPS: 0.4000 mg | ORAL_CAPSULE | Freq: Every day | ORAL | 3 refills | Status: DC

## 2022-08-27 MED ORDER — NYSTATIN 100000 UNIT/GM EX POWD: 1.0000 | Freq: Three times a day (TID) | CUTANEOUS | 5 refills | Status: AC

## 2022-08-27 MED ORDER — GLIPIZIDE 10 MG PO TABS: 10.0000 mg | ORAL_TABLET | Freq: Every day | ORAL | 3 refills | Status: DC

## 2022-08-27 MED ORDER — AMLODIPINE BESYLATE 10 MG PO TABS: ORAL_TABLET | ORAL | 3 refills | Status: DC

## 2022-08-27 NOTE — Progress Notes (Signed)
Subjective:    Patient ID: Stephen Tanner, male    DOB: 09-Nov-1970, 52 y.o.   MRN: [id number]  HPI Here for a well exam. He feels well. He does not check his glucoses routinely, but he says he has drops occasionally in the morings where he gets shaky and has to eat something. He always eats breakfast. He has never had a colonoscopy since his colon resection in 2009. He has not had an eye exam in years. He does mention a very slow urine stream and he gets up to urinate 2-3 times at night. He has tried OTC saw palmetto with no response.    Review of Systems  Constitutional: Negative.   HENT: Negative.    Eyes: Negative.   Respiratory: Negative.    Cardiovascular: Negative.   Gastrointestinal: Negative.   Genitourinary: Negative.   Musculoskeletal: Negative.   Skin: Negative.   Neurological: Negative.   Psychiatric/Behavioral: Negative.         Objective:   Physical Exam Constitutional:      General: He is not in acute distress.    Appearance: Normal appearance. He is well-developed. He is not diaphoretic.  HENT:     Head: Normocephalic and atraumatic.     Right Ear: External ear normal.     Left Ear: External ear normal.     Nose: Nose normal.     Mouth/Throat:     Pharynx: No oropharyngeal exudate.  Eyes:     General: No scleral icterus.       Right eye: No discharge.        Left eye: No discharge.     Conjunctiva/sclera: Conjunctivae normal.     Pupils: Pupils are equal, round, and reactive to light.  Neck:     Thyroid: No thyromegaly.     Vascular: No JVD.     Trachea: No tracheal deviation.  Cardiovascular:     Rate and Rhythm: Normal rate and regular rhythm.     Heart sounds: Normal heart sounds. No murmur heard.    No friction rub. No gallop.  Pulmonary:     Effort: Pulmonary effort is normal. No respiratory distress.     Breath sounds: Normal breath sounds. No wheezing or rales.  Chest:     Chest wall: No tenderness.  Abdominal:     General: Bowel sounds  are normal. There is no distension.     Palpations: Abdomen is soft. There is no mass.     Tenderness: There is no abdominal tenderness. There is no guarding or rebound.  Genitourinary:    Penis: Normal. No tenderness.      Testes: Normal.     Comments: Anus is surgically closed  Musculoskeletal:        General: No tenderness. Normal range of motion.     Cervical back: Neck supple.  Lymphadenopathy:     Cervical: No cervical adenopathy.  Skin:    General: Skin is warm and dry.     Coloration: Skin is not pale.     Findings: No erythema or rash.  Neurological:     Mental Status: He is alert and oriented to person, place, and time.     Cranial Nerves: No cranial nerve deficit.     Motor: No abnormal muscle tone.     Coordination: Coordination normal.     Deep Tendon Reflexes: Reflexes are normal and symmetric. Reflexes normal.  Psychiatric:        Behavior: Behavior normal.  Thought Content: Thought content normal.        Judgment: Judgment normal.           Assessment & Plan:  Well exam. We discussed diet and exercise. Get fasting labs. Since he seems to be having hypoglycemic spells, we will reduce the Glipizide dose to 5 mg every morning. We will set up a colonoscopy. I urged him to get a diabetic eye exam. For the BPH, he will try Tamsulosin 0.4 mg daily.  Gershon CraneStephen Asami Lambright, MD

## 2022-10-20 ENCOUNTER — Encounter: Payer: Self-pay | Admitting: Family Medicine

## 2022-10-26 ENCOUNTER — Other Ambulatory Visit: Payer: Self-pay

## 2022-10-26 MED ORDER — TAMSULOSIN HCL 0.4 MG PO CAPS
0.4000 mg | ORAL_CAPSULE | Freq: Two times a day (BID) | ORAL | 3 refills | Status: DC
  Filled 2023-08-29: qty 60, 30d supply, fill #0

## 2022-10-26 NOTE — Telephone Encounter (Signed)
Pt wife is calling and waiting on the answer to the below message she sent on 10-20-2022

## 2022-10-26 NOTE — Telephone Encounter (Signed)
We will increase the dosage to taking two 0.4 mg tablets a day. Call in #[phone] with [phone] rf

## 2023-08-29 ENCOUNTER — Other Ambulatory Visit: Payer: Self-pay | Admitting: Family Medicine

## 2023-08-29 ENCOUNTER — Other Ambulatory Visit (HOSPITAL_BASED_OUTPATIENT_CLINIC_OR_DEPARTMENT_OTHER): Payer: Self-pay

## 2023-08-30 ENCOUNTER — Ambulatory Visit: Admitting: Family Medicine

## 2023-08-31 ENCOUNTER — Ambulatory Visit: Admitting: Family Medicine

## 2023-08-31 ENCOUNTER — Other Ambulatory Visit (HOSPITAL_BASED_OUTPATIENT_CLINIC_OR_DEPARTMENT_OTHER): Payer: Self-pay

## 2023-08-31 ENCOUNTER — Encounter: Payer: Self-pay | Admitting: Family Medicine

## 2023-08-31 VITALS — BP 134/90 | HR 93 | Temp 97.8°F | Wt 229.4 lb

## 2023-08-31 DIAGNOSIS — R300 Dysuria: Secondary | ICD-10-CM

## 2023-08-31 DIAGNOSIS — R319 Hematuria, unspecified: Secondary | ICD-10-CM | POA: Diagnosis not present

## 2023-08-31 LAB — URINALYSIS, ROUTINE W REFLEX MICROSCOPIC
Leukocytes,Ua: NEGATIVE
Nitrite: NEGATIVE
Specific Gravity, Urine: >=1.03 — AB (ref 1.000–1.030)
Total Protein, Urine: 100 — AB
Urine Glucose: NEGATIVE
Urobilinogen, UA: 0.2 (ref 0.0–1.0)
pH: 5.5 (ref 5.0–8.0)

## 2023-08-31 LAB — POC URINALSYSI DIPSTICK (AUTOMATED)
Bilirubin, UA: NEGATIVE
Blood, UA: POSITIVE
Glucose, UA: NEGATIVE
Ketones, UA: POSITIVE
Nitrite, UA: NEGATIVE
Protein, UA: POSITIVE — AB
Spec Grav, UA: 1.025 (ref 1.010–1.025)
Urobilinogen, UA: 1 U/dL
pH, UA: 5 (ref 5.0–8.0)

## 2023-08-31 MED ORDER — CEPHALEXIN 500 MG PO CAPS
500.0000 mg | ORAL_CAPSULE | Freq: Three times a day (TID) | ORAL | 0 refills | Status: DC
  Filled 2023-08-31: qty 21, 7d supply, fill #0

## 2023-08-31 NOTE — Patient Instructions (Signed)
 Start the keflex  Stay well hydrated  Follow up for any fever or other concerns  We will call with culture results.

## 2023-08-31 NOTE — Progress Notes (Signed)
 Established Patient Office Visit  Subjective   Patient ID: Stephen Tanner, male    DOB: 1971-05-20  Age: 53 y.o. MRN: [id number]  Chief Complaint  Patient presents with   Urine color change     Patient complains of dark urine, x2 days, Denies dysuria    HPI   Stephen Tanner is seen today as a work in with onset couple days ago of very dark-colored urine.  He does fairly strenuous work and works outside and in the heat is not uncommon to have darkly colored urine from sweating a lot.  Monday he first noted brownish colored urine and initially thought this was just some dehydration.  He drank a fair amount Monday night and Tuesday urine looked a little bit lighter but then when he got up today he had darkly colored urine again.  No gross hematuria.  Remote history of kidney stones but no recent flank pain.  No abdominal pain.  No nausea or vomiting.  No scleral icterus or jaundice.  No new medications.  Looks like he has some mildly elevated liver transaminases.  He has colostomy bag but has not noticed any recent change in stool color.  Remote history of colorectal cancer several years ago.  Has history of type 2 diabetes but no recent labs.  Takes Glucotrol.  Other medications include tamsulosin, amlodipine, lisinopril.  Past Medical History:  Diagnosis Date   Colon cancer [facility])    rectal adenocarcinoma, sees Dr. Arlan Organ    Diabetes mellitus without complication [facility])    Hypertension    Migraines    Past Surgical History:  Procedure Laterality Date   APPENDECTOMY     COLON SURGERY  04/01/2008   abdominoperineal resection of left colon with permanent colostomy, per Dr. Jerene Canny at [facility]   TONSILLECTOMY AND ADENOIDECTOMY     TYMPANOSTOMY TUBE PLACEMENT      reports that he quit smoking about 12 years ago. His smoking use included cigarettes. He has never used smokeless tobacco. He reports current alcohol use of about 1.0 standard drink of alcohol per week. He reports current drug  use. Drugs: "Crack" cocaine, Heroin, and Marijuana. family history includes Hypertension in his father and mother. Allergies  Allergen Reactions   Lipitor [Atorvastatin]     Fatigue    Penicillins    Prednisone     Unwanted side effects lethargic   Sulfa Antibiotics     Review of Systems  Constitutional:  Negative for chills and fever.  Gastrointestinal:  Negative for abdominal pain, nausea and vomiting.  Genitourinary:  Positive for dysuria. Negative for flank pain, frequency, hematuria and urgency.      Objective:     BP (!) 134/90 (BP Location: Left Arm, Patient Position: Sitting, Cuff Size: Normal)   Pulse 93   Temp 97.8 F (36.6 C) (Oral)   Wt 229 lb 6.4 oz (104.1 kg)   SpO2 95%   BMI 31.11 kg/m  BP Readings from Last 3 Encounters:  08/31/23 (!) 134/90  08/27/22 128/82  08/21/21 128/84   Wt Readings from Last 3 Encounters:  08/31/23 229 lb 6.4 oz (104.1 kg)  08/27/22 225 lb (102.1 kg)  08/21/21 227 lb (103 kg)      Physical Exam Vitals reviewed.  Constitutional:      General: He is not in acute distress.    Appearance: He is not ill-appearing.  Cardiovascular:     Rate and Rhythm: Normal rate and regular rhythm.  Pulmonary:  Effort: Pulmonary effort is normal.     Breath sounds: Normal breath sounds.  Abdominal:     Palpations: Abdomen is soft.     Tenderness: There is no abdominal tenderness. There is no guarding or rebound.  Neurological:     Mental Status: He is alert.      Results for orders placed or performed in visit on 08/31/23  POCT Urinalysis Dipstick (Automated)  Result Value Ref Range   Color, UA Brown    Clarity, UA Cloudy    Glucose, UA Negative Negative   Bilirubin, UA Negative    Ketones, UA Positive    Spec Grav, UA 1.025 1.010 - 1.025   Blood, UA Positive    pH, UA 5.0 5.0 - 8.0   Protein, UA Positive (A) Negative   Urobilinogen, UA 1.0 0.2 or 1.0 E.U./dL   Nitrite, UA Negative    Leukocytes, UA Small (1+) (A)  Negative      The 10-year ASCVD risk score (Arnett DK, et al., 2019) is: 10.7%    Assessment & Plan:   Problem List Items Addressed This Visit   None Visit Diagnoses       Dysuria    -  Primary   Relevant Orders   POCT Urinalysis Dipstick (Automated) (Completed)   Urine Culture   Urinalysis with Reflex Microscopic     Patient presents with 2-day history of darkly colored urine.  No evidence for bilirubinuria.  He does have 1+ leukocytes and positive blood of uncertain significance.  Negative nitrites.  No burning with urination.  No reported fever.  May have some dehydration.  We recommended the following  -Increase hydration - Send urine for urinalysis with microscopy - Send urine for culture - Start Keflex 500 mg 3 times daily for 7 days pending culture result - If culture negative and microscopy confirms significant hematuria will need further evaluation for that  No follow-ups on file.    Evelena Peat, MD

## 2023-09-01 LAB — URINE CULTURE
MICRO NUMBER:: 16308698
Result:: NO GROWTH
SPECIMEN QUALITY:: ADEQUATE

## 2023-09-02 NOTE — Addendum Note (Signed)
 Addended by: Christy Sartorius on: 09/02/2023 09:44 AM   Modules accepted: Orders

## 2023-09-05 ENCOUNTER — Ambulatory Visit (INDEPENDENT_AMBULATORY_CARE_PROVIDER_SITE_OTHER): Admitting: Family Medicine

## 2023-09-05 ENCOUNTER — Other Ambulatory Visit (HOSPITAL_BASED_OUTPATIENT_CLINIC_OR_DEPARTMENT_OTHER): Payer: Self-pay

## 2023-09-05 ENCOUNTER — Encounter: Payer: Self-pay | Admitting: Family Medicine

## 2023-09-05 VITALS — BP 138/86 | HR 93 | Temp 98.1°F | Ht 71.5 in | Wt 229.0 lb

## 2023-09-05 DIAGNOSIS — Z13220 Encounter for screening for lipoid disorders: Secondary | ICD-10-CM

## 2023-09-05 DIAGNOSIS — C20 Malignant neoplasm of rectum: Secondary | ICD-10-CM

## 2023-09-05 DIAGNOSIS — Z125 Encounter for screening for malignant neoplasm of prostate: Secondary | ICD-10-CM

## 2023-09-05 DIAGNOSIS — Z0000 Encounter for general adult medical examination without abnormal findings: Secondary | ICD-10-CM | POA: Diagnosis not present

## 2023-09-05 MED ORDER — LISINOPRIL 40 MG PO TABS
40.0000 mg | ORAL_TABLET | Freq: Every day | ORAL | 3 refills | Status: DC
  Filled 2023-09-05: qty 30, 30d supply, fill #0
  Filled 2023-10-18: qty 30, 30d supply, fill #1
  Filled 2023-11-15: qty 30, 30d supply, fill #2

## 2023-09-05 MED ORDER — AMLODIPINE BESYLATE 10 MG PO TABS
10.0000 mg | ORAL_TABLET | Freq: Every day | ORAL | 3 refills | Status: AC
  Filled 2023-09-05: qty 90, 90d supply, fill #0
  Filled 2023-09-06 – 2023-11-15 (×2): qty 30, 30d supply, fill #0
  Filled 2023-12-14: qty 30, 30d supply, fill #1
  Filled 2024-01-28: qty 30, 30d supply, fill #2

## 2023-09-05 MED ORDER — GLIPIZIDE 10 MG PO TABS
10.0000 mg | ORAL_TABLET | Freq: Every day | ORAL | 3 refills | Status: AC
  Filled 2023-09-05: qty 30, 30d supply, fill #0
  Filled 2023-10-18: qty 30, 30d supply, fill #1
  Filled 2023-11-15: qty 30, 30d supply, fill #2
  Filled 2023-12-14: qty 30, 30d supply, fill #3
  Filled 2024-01-09 – 2024-01-25 (×2): qty 30, 30d supply, fill #4
  Filled 2024-02-28: qty 30, 30d supply, fill #5
  Filled 2024-03-27: qty 30, 30d supply, fill #6
  Filled 2024-05-06: qty 30, 30d supply, fill #7
  Filled 2024-06-04: qty 30, 30d supply, fill #8

## 2023-09-05 MED ORDER — TAMSULOSIN HCL 0.4 MG PO CAPS
0.4000 mg | ORAL_CAPSULE | Freq: Two times a day (BID) | ORAL | 3 refills | Status: AC
  Filled 2023-09-05: qty 180, 90d supply, fill #0
  Filled 2023-11-03: qty 60, 30d supply, fill #0
  Filled 2023-12-14: qty 60, 30d supply, fill #1

## 2023-09-05 NOTE — Progress Notes (Signed)
 Subjective:    Patient ID: Stephen Tanner, male    DOB: 1971/04/20, 53 y.o.   MRN: [id number]  HPI Here for a well exam. He feels fine. He was here on 08-31-23 for urine discoloration, and he was felt to have a UTI. He was started on 7 days of Kelfex, and his urine appears to be back to normal. The urine culture showed no growth.    Review of Systems  Constitutional: Negative.   HENT: Negative.    Eyes: Negative.   Respiratory: Negative.    Cardiovascular: Negative.   Gastrointestinal: Negative.   Genitourinary: Negative.   Musculoskeletal: Negative.   Skin: Negative.   Neurological: Negative.   Psychiatric/Behavioral: Negative.         Objective:   Physical Exam Constitutional:      General: He is not in acute distress.    Appearance: Normal appearance. He is well-developed. He is not diaphoretic.  HENT:     Head: Normocephalic and atraumatic.     Right Ear: External ear normal.     Left Ear: External ear normal.     Nose: Nose normal.     Mouth/Throat:     Pharynx: No oropharyngeal exudate.  Eyes:     General: No scleral icterus.       Right eye: No discharge.        Left eye: No discharge.     Conjunctiva/sclera: Conjunctivae normal.     Pupils: Pupils are equal, round, and reactive to light.  Neck:     Thyroid: No thyromegaly.     Vascular: No JVD.     Trachea: No tracheal deviation.  Cardiovascular:     Rate and Rhythm: Normal rate and regular rhythm.     Pulses: Normal pulses.     Heart sounds: Normal heart sounds. No murmur heard.    No friction rub. No gallop.  Pulmonary:     Effort: Pulmonary effort is normal. No respiratory distress.     Breath sounds: Normal breath sounds. No wheezing or rales.  Chest:     Chest wall: No tenderness.  Abdominal:     General: Bowel sounds are normal. There is no distension.     Palpations: Abdomen is soft. There is no mass.     Tenderness: There is no abdominal tenderness. There is no guarding or rebound.      Comments: Has an ostomy bag   Genitourinary:    Penis: Normal. No tenderness.      Testes: Normal.  Musculoskeletal:        General: No tenderness. Normal range of motion.     Cervical back: Neck supple.  Lymphadenopathy:     Cervical: No cervical adenopathy.  Skin:    General: Skin is warm and dry.     Coloration: Skin is not pale.     Findings: No erythema or rash.  Neurological:     General: No focal deficit present.     Mental Status: He is alert and oriented to person, place, and time.     Cranial Nerves: No cranial nerve deficit.     Motor: No abnormal muscle tone.     Coordination: Coordination normal.     Deep Tendon Reflexes: Reflexes are normal and symmetric. Reflexes normal.  Psychiatric:        Mood and Affect: Mood normal.        Behavior: Behavior normal.        Thought Content: Thought content normal.  Judgment: Judgment normal.           Assessment & Plan:  Well exam. We discussed diet and exercise. Get fasting labs. Refer for another colonoscopy. He will finish up the Keflex. Corita Diego, MD

## 2023-09-06 ENCOUNTER — Telehealth: Payer: Self-pay | Admitting: *Deleted

## 2023-09-06 ENCOUNTER — Other Ambulatory Visit (HOSPITAL_BASED_OUTPATIENT_CLINIC_OR_DEPARTMENT_OTHER): Payer: Self-pay

## 2023-09-06 LAB — CBC WITH DIFFERENTIAL/PLATELET
Basophils Absolute: 0.1 10*3/uL (ref 0.0–0.1)
Basophils Relative: 0.8 % (ref 0.0–3.0)
Eosinophils Absolute: 0.1 10*3/uL (ref 0.0–0.7)
Eosinophils Relative: 1.2 % (ref 0.0–5.0)
HCT: 48.1 % (ref 39.0–52.0)
Hemoglobin: 16.4 g/dL (ref 13.0–17.0)
Lymphocytes Relative: 40.5 % (ref 12.0–46.0)
Lymphs Abs: 2.8 10*3/uL (ref 0.7–4.0)
MCHC: 34 g/dL (ref 30.0–36.0)
MCV: 93.8 fl (ref 78.0–100.0)
Monocytes Absolute: 0.5 10*3/uL (ref 0.1–1.0)
Monocytes Relative: 7.4 % (ref 3.0–12.0)
Neutro Abs: 3.5 10*3/uL (ref 1.4–7.7)
Neutrophils Relative %: 50.1 % (ref 43.0–77.0)
Platelets: 245 10*3/uL (ref 150.0–400.0)
RBC: 5.13 Mil/uL (ref 4.22–5.81)
RDW: 12.9 % (ref 11.5–15.5)
WBC: 6.9 10*3/uL (ref 4.0–10.5)

## 2023-09-06 LAB — HEPATIC FUNCTION PANEL
ALT: 64 U/L — ABNORMAL HIGH (ref 0–53)
AST: 30 U/L (ref 0–37)
Albumin: 4.9 g/dL (ref 3.5–5.2)
Alkaline Phosphatase: 55 U/L (ref 39–117)
Bilirubin, Direct: 0.1 mg/dL (ref 0.0–0.3)
Total Bilirubin: 0.5 mg/dL (ref 0.2–1.2)
Total Protein: 7.2 g/dL (ref 6.0–8.3)

## 2023-09-06 LAB — BASIC METABOLIC PANEL WITH GFR
BUN: 18 mg/dL (ref 6–23)
CO2: 30 meq/L (ref 19–32)
Calcium: 9.9 mg/dL (ref 8.4–10.5)
Chloride: 102 meq/L (ref 96–112)
Creatinine, Ser: 0.95 mg/dL (ref 0.40–1.50)
GFR: 91.91 mL/min (ref >60.00)
Glucose, Bld: 99 mg/dL (ref 70–99)
Potassium: 4.6 meq/L (ref 3.5–5.1)
Sodium: 139 meq/L (ref 135–145)

## 2023-09-06 LAB — LIPID PANEL
Cholesterol: 260 mg/dL — ABNORMAL HIGH (ref 0–200)
HDL: 58.2 mg/dL
LDL Cholesterol: 170 mg/dL — ABNORMAL HIGH (ref 0–99)
NonHDL: 202.19
Total CHOL/HDL Ratio: 4
Triglycerides: 162 mg/dL — ABNORMAL HIGH (ref 0.0–149.0)
VLDL: 32.4 mg/dL (ref 0.0–40.0)

## 2023-09-06 LAB — TSH: TSH: 0.78 u[IU]/mL (ref 0.35–5.50)

## 2023-09-06 LAB — HEMOGLOBIN A1C: Hgb A1c MFr Bld: 6.7 % — ABNORMAL HIGH (ref 4.6–6.5)

## 2023-09-06 LAB — PSA: PSA: 0.69 ng/mL (ref 0.10–4.00)

## 2023-09-06 NOTE — Telephone Encounter (Signed)
 Copied from CRM [id number]. Topic: Clinical - Lab/Test Results >> Sep 06, 2023  2:59 PM Kita Perish H wrote: Reason for CRM: Patients wife is calling to see when patients labs will be released into MyChart so she can view them, please reach out to wife thanks.  Devra Fontana [phone]

## 2023-09-06 NOTE — Telephone Encounter (Signed)
 Spoke with pt spouse, advised that lab results have been released on MyChart aware that Dr Alyne Babinski has not viewed results, pt is aware that the office will call her after Dr Alyne Babinski reviews results

## 2023-09-08 ENCOUNTER — Encounter: Payer: Self-pay | Admitting: Family Medicine

## 2023-09-12 ENCOUNTER — Telehealth: Payer: Self-pay

## 2023-09-12 ENCOUNTER — Other Ambulatory Visit (INDEPENDENT_AMBULATORY_CARE_PROVIDER_SITE_OTHER)

## 2023-09-12 DIAGNOSIS — R300 Dysuria: Secondary | ICD-10-CM

## 2023-09-12 LAB — URINALYSIS, ROUTINE W REFLEX MICROSCOPIC
Bilirubin Urine: NEGATIVE
Ketones, ur: NEGATIVE
Leukocytes,Ua: NEGATIVE
Nitrite: NEGATIVE
Specific Gravity, Urine: 1.02 (ref 1.000–1.030)
Total Protein, Urine: NEGATIVE
Urine Glucose: 100 — AB
Urobilinogen, UA: 0.2 (ref 0.0–1.0)
pH: 6 (ref 5.0–8.0)

## 2023-09-12 NOTE — Telephone Encounter (Signed)
 Copied from CRM [id number]. Topic: General - Other >> Sep 08, 2023 10:42 AM Bambi Bonine D wrote: Reason for CRM: Patient's wife Devra Fontana stated that the patient did not receive a follow up regarding his physical and recent lab work. Devra Fontana stated the patient's cholesterol levels are high and wondering if he should he be on medication? Patient also passed a kidney stone yesterday. Patient wants to know if he will be seeing Dr.Fry for additional labs to discuss these findings? Devra Fontana would like this to be addressed today and call back number provided is [phone].

## 2023-09-13 ENCOUNTER — Ambulatory Visit: Payer: Self-pay

## 2023-09-13 NOTE — Telephone Encounter (Signed)
 Pt labs have not been reviewed, will notify pt soon as Dr Alyne Babinski reviews them

## 2023-09-13 NOTE — Telephone Encounter (Signed)
See my Result Note  

## 2023-09-13 NOTE — Telephone Encounter (Signed)
 Copied from CRM [id number]. Topic: Clinical - Lab/Test Results >> Sep 13, 2023 11:56 AM Chuck Crater wrote: Reason for CRM: Patient wife has questions in regards to husband test results.   Devra Fontana, the patient's DPR called for clarification of lab results. I advised of what the lab notes from Dr. Darren Em shows and advised that the office would call them with the next steps once Dr. Alyne Babinski has had a chance to review them. She understood and is agreeable with this plan.     Reason for Disposition  Caller requesting lab results  (Exception: Routine or non-urgent lab result.)  Answer Assessment - Initial Assessment Questions 1. REASON FOR CALL or QUESTION: "What is your reason for calling today?" or "How can I best help you?" or "What question do you have that I can help answer?"     Lab results  2. CALLER: Document the source of call. (e.g., laboratory, patient).     Devra Fontana, patient's DPR  Protocols used: PCP Call - No Triage-A-AH

## 2023-09-14 NOTE — Addendum Note (Signed)
 Addended by: Aurelio Leer on: 09/14/2023 08:51 AM   Modules accepted: Orders

## 2023-09-14 NOTE — Telephone Encounter (Signed)
 Lab reviewed with pt aware that Dr Darren Em placed referral to urology.

## 2023-09-14 NOTE — Telephone Encounter (Signed)
Spoke with pt reviewed lab results with pt verbalized understanding.

## 2023-09-16 ENCOUNTER — Other Ambulatory Visit (HOSPITAL_BASED_OUTPATIENT_CLINIC_OR_DEPARTMENT_OTHER): Payer: Self-pay

## 2023-09-16 ENCOUNTER — Encounter: Payer: Self-pay | Admitting: Family Medicine

## 2023-09-20 ENCOUNTER — Encounter: Payer: Self-pay | Admitting: Family Medicine

## 2023-09-20 NOTE — Telephone Encounter (Signed)
 Pt wife spoke with e2c2 and would like to know if med will be called in

## 2023-09-21 ENCOUNTER — Other Ambulatory Visit (HOSPITAL_BASED_OUTPATIENT_CLINIC_OR_DEPARTMENT_OTHER): Payer: Self-pay

## 2023-09-21 MED ORDER — ROSUVASTATIN CALCIUM 10 MG PO TABS
10.0000 mg | ORAL_TABLET | Freq: Every day | ORAL | 3 refills | Status: AC
  Filled 2023-09-21: qty 30, 30d supply, fill #0
  Filled 2023-10-18: qty 30, 30d supply, fill #1
  Filled 2023-11-15: qty 30, 30d supply, fill #2
  Filled 2023-12-14: qty 30, 30d supply, fill #3
  Filled 2024-01-09 – 2024-01-25 (×2): qty 30, 30d supply, fill #4
  Filled 2024-02-28: qty 30, 30d supply, fill #5
  Filled 2024-04-04 (×2): qty 30, 30d supply, fill #6
  Filled 2024-05-06: qty 30, 30d supply, fill #7
  Filled 2024-06-04: qty 30, 30d supply, fill #8

## 2023-09-21 NOTE — Telephone Encounter (Signed)
 Rx sent to pt pharmacy and pt notified

## 2023-09-21 NOTE — Addendum Note (Signed)
 Addended by: Corita Diego A on: 09/21/2023 08:28 AM   Modules accepted: Orders

## 2023-09-23 ENCOUNTER — Other Ambulatory Visit (HOSPITAL_BASED_OUTPATIENT_CLINIC_OR_DEPARTMENT_OTHER): Payer: Self-pay

## 2023-10-19 ENCOUNTER — Other Ambulatory Visit (HOSPITAL_BASED_OUTPATIENT_CLINIC_OR_DEPARTMENT_OTHER): Payer: Self-pay

## 2023-10-23 ENCOUNTER — Other Ambulatory Visit: Payer: Self-pay | Admitting: Family Medicine

## 2023-11-03 ENCOUNTER — Other Ambulatory Visit (HOSPITAL_BASED_OUTPATIENT_CLINIC_OR_DEPARTMENT_OTHER): Payer: Self-pay

## 2023-11-04 ENCOUNTER — Other Ambulatory Visit (HOSPITAL_BASED_OUTPATIENT_CLINIC_OR_DEPARTMENT_OTHER): Payer: Self-pay

## 2023-11-07 ENCOUNTER — Other Ambulatory Visit (HOSPITAL_BASED_OUTPATIENT_CLINIC_OR_DEPARTMENT_OTHER): Payer: Self-pay

## 2023-11-11 ENCOUNTER — Other Ambulatory Visit: Payer: Self-pay | Admitting: Urology

## 2023-11-15 ENCOUNTER — Other Ambulatory Visit (HOSPITAL_BASED_OUTPATIENT_CLINIC_OR_DEPARTMENT_OTHER): Payer: Self-pay

## 2023-11-22 ENCOUNTER — Other Ambulatory Visit: Payer: Self-pay | Admitting: Family Medicine

## 2023-11-29 NOTE — Progress Notes (Addendum)
 COVID Vaccine received:  [x]  No []  Yes Date of any COVID positive Test in last 90 days: no PCP - Dr. Garnette Olmsted Cardiologist - n/a  Chest x-ray -  EKG -   Stress Test -  ECHO -  Cardiac Cath -   Bowel Prep - [x]  No  []   Yes ______  Pacemaker / ICD device [x]  No []  Yes   Spinal Cord Stimulator:[x]  No []  Yes       History of Sleep Apnea? []  No [x]  Yes   CPAP used?- []  No [x]  Yes    Does the patient monitor blood sugar?          []  No [x]  Yes  []  N/A  Patient has: []  NO Hx DM   []  Pre-DM                 []  DM1  [x]   DM2 Does patient have a [employer] or Dexacom? [x]  No []  Yes   Fasting Blood Sugar Ranges- 107-108 Checks Blood Sugar __1___ times a week  GLP1 agonist / usual dose - no GLP1 instructions:  SGLT-2 inhibitors / usual dose - no SGLT-2 instructions:   Blood Thinner / Instructions:no Aspirin Instructions:no  Comments:   Activity level: Patient is able to climb a flight of stairs without difficulty; [x]  No CP  [x]  No SOB,   Patient can perform ADLs without assistance.   Anesthesia review:   Patient denies shortness of breath, fever, cough and chest pain at PAT appointment.  Patient verbalized understanding and agreement to the Pre-Surgical Instructions that were given to them at this PAT appointment. Patient was also educated of the need to review these PAT instructions again prior to his/her surgery.I reviewed the appropriate phone numbers to call if they have any and questions or concerns.

## 2023-11-29 NOTE — Patient Instructions (Addendum)
 SURGICAL WAITING ROOM VISITATION  Patients having surgery or a procedure may have no more than 2 support people in the waiting area - these visitors may rotate.    Children under the age of 21 must have an adult with them who is not the patient.  Visitors with respiratory illnesses are discouraged from visiting and should remain at home.  If the patient needs to stay at the hospital during part of their recovery, the visitor guidelines for inpatient rooms apply. Pre-op nurse will coordinate an appropriate time for 1 support person to accompany patient in pre-op.  This support person may not rotate.    Please refer to the [facility] website for the visitor guidelines for Inpatients (after your surgery is over and you are in a regular room).       Your procedure is scheduled on: 12/13/23   Report to [facility] Main Entrance    Report to admitting at 12noon   Call this number if you have problems the morning of surgery [phone]   Do not eat food :After Midnight.   After Midnight you may have the following liquids until 8:15 AM DAY OF SURGERY  Water              Gatorade- no red gatorade      Oral Hygiene is also important to reduce your risk of infection.                                    Remember - BRUSH YOUR TEETH THE MORNING OF SURGERY WITH YOUR REGULAR TOOTHPASTE   Stop all vitamins and herbal supplements 7 days before surgery.   Take these medicines the morning of surgery with A SIP OF WATER: amlodipine , rosuvastatin , tamsulosin                        Do not take Lisinopril  the morning of surgery.  DO NOT TAKE ANY ORAL DIABETIC MEDICATIONS DAY OF YOUR SURGERY Do not take Glipizide  the morning of surgery.  Bring CPAP mask and tubing day of surgery.                              You may not have any metal on your body including hair pins, jewelry, and body piercing             Do not wear make-up, lotions, powders, perfumes/cologne, or deodorant               Men may shave face and neck.   Do not bring valuables to the hospital. [facility] IS NOT             RESPONSIBLE   FOR VALUABLES.   Contacts, glasses, dentures or bridgework may not be worn into surgery.  DO NOT BRING YOUR HOME MEDICATIONS TO THE HOSPITAL. PHARMACY WILL DISPENSE MEDICATIONS LISTED ON YOUR MEDICATION LIST TO YOU DURING YOUR ADMISSION IN THE HOSPITAL!    Patients discharged on the day of surgery will not be allowed to drive home.  Someone NEEDS to stay with you for the first 24 hours after anesthesia.   Special Instructions: Bring a copy of your healthcare power of attorney and living will documents the day of surgery if you haven't scanned them before.              Please  read over the following fact sheets you were given: IF YOU HAVE QUESTIONS ABOUT YOUR PRE-OP INSTRUCTIONS PLEASE CALL [phone] [place]   If you received a COVID test during your pre-op visit  it is requested that you wear a mask when out in public, stay away from anyone that may not be feeling well and notify your surgeon if you develop symptoms. If you test positive for Covid or have been in contact with anyone that has tested positive in the last 10 days please notify you surgeon.    [facility] - Preparing for Surgery Before surgery, you can play an important role.  Because skin is not sterile, your skin needs to be as free of germs as possible.  You can reduce the number of germs on your skin by washing with CHG (chlorahexidine gluconate) soap before surgery.  CHG is an antiseptic cleaner which kills germs and bonds with the skin to continue killing germs even after washing. Please DO NOT use if you have an allergy to CHG or antibacterial soaps.  If your skin becomes reddened/irritated stop using the CHG and inform your nurse when you arrive at Short Stay. Do not shave (including legs and underarms) for at least 48 hours prior to the first CHG shower.  You may shave your face/neck.  Please follow  these instructions carefully:  1.  Shower with CHG Soap the night before surgery and the  morning of surgery.  2.  If you choose to wash your hair, wash your hair first as usual with your normal  shampoo.  3.  After you shampoo, rinse your hair and body thoroughly to remove the shampoo.                             4.  Use CHG as you would any other liquid soap.  You can apply chg directly to the skin and wash.  Gently with a scrungie or clean washcloth.  5.  Apply the CHG Soap to your body ONLY FROM THE NECK DOWN.   Do   not use on face/ open                           Wound or open sores. Avoid contact with eyes, ears mouth and   genitals (private parts).                       Wash face,  Genitals (private parts) with your normal soap.             6.  Wash thoroughly, paying special attention to the area where your    surgery  will be performed.  7.  Thoroughly rinse your body with warm water from the neck down.  8.  DO NOT shower/wash with your normal soap after using and rinsing off the CHG Soap.                9.  Pat yourself dry with a clean towel.            10.  Wear clean pajamas.            11.  Place clean sheets on your bed the night of your first shower and do not  sleep with pets. Day of Surgery : Do not apply any lotions/deodorants the morning of surgery.  Please wear clean clothes to the hospital/surgery center.  FAILURE TO FOLLOW THESE INSTRUCTIONS MAY RESULT IN THE CANCELLATION OF YOUR SURGERY  ________________________________________________________________________How to Manage Your Diabetes Before and After Surgery  Why is it important to control my blood sugar before and after surgery? Improving blood sugar levels before and after surgery helps healing and can limit problems. A way of improving blood sugar control is eating a healthy diet by:  Eating less sugar and carbohydrates  Increasing activity/exercise  Talking with your doctor about reaching your blood sugar  goals High blood sugars (greater than 180 mg/dL) can raise your risk of infections and slow your recovery, so you will need to focus on controlling your diabetes during the weeks before surgery. Make sure that the doctor who takes care of your diabetes knows about your planned surgery including the date and location.  How do I manage my blood sugar before surgery? Check your blood sugar at least 4 times a day, starting 2 days before surgery, to make sure that the level is not too high or low. Check your blood sugar the morning of your surgery when you wake up and every 2 hours until you get to the Short Stay unit. If your blood sugar is less than 70 mg/dL, you will need to treat for low blood sugar: Do not take insulin. Treat a low blood sugar (less than 70 mg/dL) with  cup of clear juice (cranberry or apple), 4 glucose tablets, OR glucose gel. Recheck blood sugar in 15 minutes after treatment (to make sure it is greater than 70 mg/dL). If your blood sugar is not greater than 70 mg/dL on recheck, call [phone] for further instructions. Report your blood sugar to the short stay nurse when you get to Short Stay.  If you are admitted to the hospital after surgery: Your blood sugar will be checked by the staff and you will probably be given insulin after surgery (instead of oral diabetes medicines) to make sure you have good blood sugar levels. The goal for blood sugar control after surgery is 80-180 mg/dL.   WHAT DO I DO ABOUT MY DIABETES MEDICATION?  Do not take oral diabetes medicines (pills) the morning of surgery.  Patient Signature:  Date:   Nurse Signature:  Date:

## 2023-12-02 ENCOUNTER — Other Ambulatory Visit: Payer: Self-pay

## 2023-12-02 ENCOUNTER — Encounter (HOSPITAL_COMMUNITY): Payer: Self-pay

## 2023-12-02 ENCOUNTER — Encounter (HOSPITAL_COMMUNITY)
Admission: RE | Admit: 2023-12-02 | Discharge: 2023-12-02 | Disposition: A | Discharge status: Home / Self Care | Source: Ambulatory Visit | Attending: Urology | Admitting: Urology

## 2023-12-02 VITALS — BP 120/79 | HR 79 | Temp 98.2°F | Resp 16 | Ht 71.0 in | Wt 220.0 lb

## 2023-12-02 DIAGNOSIS — Z01818 Encounter for other preprocedural examination: Secondary | ICD-10-CM | POA: Insufficient documentation

## 2023-12-02 DIAGNOSIS — I10 Essential (primary) hypertension: Secondary | ICD-10-CM | POA: Diagnosis not present

## 2023-12-02 DIAGNOSIS — E119 Type 2 diabetes mellitus without complications: Secondary | ICD-10-CM | POA: Diagnosis not present

## 2023-12-02 HISTORY — DX: Unspecified osteoarthritis, unspecified site: M19.90

## 2023-12-02 HISTORY — DX: Personal history of urinary calculi: Z87.442

## 2023-12-02 LAB — CBC
HCT: 44.9 % (ref 39.0–52.0)
Hemoglobin: 15.7 g/dL (ref 13.0–17.0)
MCH: 31.5 pg (ref 26.0–34.0)
MCHC: 35 g/dL (ref 30.0–36.0)
MCV: 90.2 fL (ref 80.0–100.0)
Platelets: 195 K/uL (ref 150–400)
RBC: 4.98 MIL/uL (ref 4.22–5.81)
RDW: 12.2 % (ref 11.5–15.5)
WBC: 5.7 K/uL (ref 4.0–10.5)
nRBC: 0 % (ref 0.0–0.2)

## 2023-12-02 LAB — HEMOGLOBIN A1C
Hgb A1c MFr Bld: 6.6 % — ABNORMAL HIGH (ref 4.8–5.6)
Mean Plasma Glucose: 143 mg/dL

## 2023-12-02 LAB — BASIC METABOLIC PANEL WITH GFR
Anion gap: 11 (ref 5–15)
BUN: 20 mg/dL (ref 6–20)
CO2: 21 mmol/L — ABNORMAL LOW (ref 22–32)
Calcium: 9.1 mg/dL (ref 8.9–10.3)
Chloride: 102 mmol/L (ref 98–111)
Creatinine, Ser: 0.71 mg/dL (ref 0.61–1.24)
GFR, Estimated: >60 mL/min (ref >60)
Glucose, Bld: 141 mg/dL — ABNORMAL HIGH (ref 70–99)
Potassium: 4.3 mmol/L (ref 3.5–5.1)
Sodium: 134 mmol/L — ABNORMAL LOW (ref 135–145)

## 2023-12-02 LAB — GLUCOSE, CAPILLARY: Glucose-Capillary: 133 mg/dL — ABNORMAL HIGH (ref 70–99)

## 2023-12-12 NOTE — H&P (Signed)
 CC/HPI: cc: Gross hematuria, BPH with LUTS, urolithiasis   10/14/2023: 53 year old man referred for 6-week history of gross hematuria. He has a history of urolithiasis and passes stones frequently. He did start passing stones a few days after gross hematuria started. He is also been on tamsulosin  twice daily for about 6 to 8 months with no significant improvement in symptoms. PSA 0.6 in April 2025. He has an elevated PVR of 274 today. He has a history of colon cancer and has a colostomy.   IPSS: 2, 2, 1, 0, 0, 0, 3= 8/35  3/6 mixed quality of life   12/02/2023:  Stephen Tanner is here today for preop evaluation. The patient has left ureteroscopy with laser lithotripsy and stent placement scheduled with Dr. Elisabeth on 12/13/2023. He had his preop appointment with anesthesia at [facility] this morning. The patient denies any shortness of breath or chest pain. He has not had any fevers and does not have any complaints today. Urinalysis is reassuring.     ALLERGIES: Lipitor - Other Reaction, Fatigue Penicillins Prednisone - Other Reaction, Unwanted side effects lethargic. Sulfa Drugs    MEDICATIONS: Crestor  10 MG Tablet  Lisinopril  40 MG Tablet  Tamsulosin  HCl 0.4 MG Capsule  amLODIPine  Besylate 10 MG Tablet  glipiZIDE  10 MG Tablet     GU PSH: No GU PSH      PSH Notes: Tonsillectomy, Appendectomy, Inner Ear Surgery, Rectal Surgery, Colostomy   NON-GU PSH: Appendectomy - 2010 Colostomy - 2010 Inner Ear Surgery Procedure - 2010 Rectal Surgery (Unspecified) - 2010 Remove Tonsils - 2010     GU PMH: BPH w/LUTS - 11/01/2023, - 10/14/2023 Gross hematuria - 11/01/2023, - 10/14/2023 History of urolithiasis (Stable) - 10/14/2023, Nephrolithiasis, - 2014 Incomplete bladder emptying - 10/14/2023 Nocturia - 10/14/2023 Hematospermia, Hematospermia - 2014 Other microscopic hematuria, Microscopic Hematuria - 2014 Personal Hx Oth male genital organs diseases, History of acute prostatitis - 2014 Renal  calculus, Kidney stone on left side - 2014      PMH Notes:  1898-05-24 00:00:00 - Note: Normal Routine History And Physical Adult  2008-09-30 09:09:19 - Note: Rectal Cancer   NON-GU PMH: Asthma, Asthma - 2014 Congenital single renal cyst, Congenital renal cyst, single - 2014 Personal history of other diseases of the circulatory system, History of hypertension - 2014 Personal history of other diseases of the digestive system, History of esophageal reflux - 2014 Diabetes Type 2 Hypertension Sleep Apnea    FAMILY HISTORY: 1 son - Son Family Health Status Number - Runs In Family   SOCIAL HISTORY: Marital Status: Married Preferred Language: English; Race: White Current Smoking Status: Patient does not smoke anymore.   Tobacco Use Assessment Completed: Used Tobacco in last 30 days? Light Drinker.  Drinks 1 caffeinated drink per day.     Notes: Marital History - Currently Married, Tobacco Use, Alcohol Use, Occupation:, Caffeine Use   REVIEW OF SYSTEMS:    GU Review Male:   Patient denies frequent urination, hard to postpone urination, burning/ pain with urination, get up at night to urinate, leakage of urine, stream starts and stops, trouble starting your stream, have to strain to urinate , erection problems, and penile pain.  Gastrointestinal (Upper):   Patient denies nausea, vomiting, and indigestion/ heartburn.  Gastrointestinal (Lower):   Patient denies constipation and diarrhea.  Constitutional:   Patient denies fever, night sweats, weight loss, and fatigue.  Skin:   Patient denies skin rash/ lesion and itching.  Eyes:   Patient denies blurred vision and  double vision.  Ears/ Nose/ Throat:   Patient denies sore throat and sinus problems.  Hematologic/Lymphatic:   Patient denies swollen glands and easy bruising.  Cardiovascular:   Patient denies leg swelling and chest pains.  Respiratory:   Patient denies cough and shortness of breath.  Endocrine:   Patient denies excessive  thirst.  Musculoskeletal:   Patient denies back pain and joint pain.  Neurological:   Patient denies headaches and dizziness.  Psychologic:   Patient denies depression and anxiety.   VITAL SIGNS:      12/02/2023 12:17 PM  BP 120/72 mmHg  Pulse 80 /min  Temperature 97.7 F / 36.5 C   GU PHYSICAL EXAMINATION:    Anus and Perineum: No hemorrhoids. No anal stenosis. No rectal fissure, no anal fissure. No edema, no dimple, no perineal tenderness, no anal tenderness.  Scrotum: No lesions. No edema. No cysts. No warts.  Epididymides: Right: no spermatocele, no masses, no cysts, no tenderness, no induration, no enlargement. Left: no spermatocele, no masses, no cysts, no tenderness, no induration, no enlargement.  Testes: No tenderness, no swelling, no enlargement left testes. No tenderness, no swelling, no enlargement right testes. Normal location left testes. Normal location right testes. No mass, no cyst, no varicocele, no hydrocele left testes. No mass, no cyst, no varicocele, no hydrocele right testes.  Urethral Meatus: Normal size. No lesion, no wart, no discharge, no polyp. Normal location.  Penis: Circumcised, no warts, no cracks. No dorsal Peyronie's plaques, no left corporal Peyronie's plaques, no right corporal Peyronie's plaques, no scarring, no warts. No balanitis, no meatal stenosis.  Prostate: 40 gram or 2+ size. Left lobe normal consistency, right lobe normal consistency. Symmetrical lobes. No prostate nodule. Left lobe no tenderness, right lobe no tenderness.  Seminal Vesicles: Nonpalpable.  Sphincter Tone: Normal sphincter. No rectal tenderness. No rectal mass.    MULTI-SYSTEM PHYSICAL EXAMINATION:    Constitutional: Well-nourished. No physical deformities. Normally developed. Good grooming.  Neck: Neck symmetrical, not swollen. Normal tracheal position.  Respiratory: No labored breathing, no use of accessory muscles.   Cardiovascular: Normal temperature, normal extremity pulses, no  swelling, no varicosities.  Lymphatic: No enlargement of neck, axillae, groin.  Skin: No paleness, no jaundice, no cyanosis. No lesion, no ulcer, no rash.  Neurologic / Psychiatric: Oriented to time, oriented to place, oriented to person. No depression, no anxiety, no agitation.  Gastrointestinal: No mass, no tenderness, no rigidity, non obese abdomen.  Eyes: Normal conjunctivae. Normal eyelids.  Ears, Nose, Mouth, and Throat: Left ear no scars, no lesions, no masses. Right ear no scars, no lesions, no masses. Nose no scars, no lesions, no masses. Normal hearing. Normal lips.  Musculoskeletal: Normal gait and station of head and neck.     Complexity of Data:  Source Of History:  Patient, Medical Record Summary  Records Review:   Previous Doctor Records, Previous Patient Records  Urine Test Review:   Urinalysis   PROCEDURES:          Visit Complexity - G2211          Urinalysis - 81003 Dipstick Dipstick Cont'd  Color: Yellow Bilirubin: Neg mg/dL  Appearance: Clear Ketones: Neg mg/dL  Specific Gravity: 8.974 Blood: Neg ery/uL  pH: 5.5 Protein: Trace mg/dL  Glucose: Neg mg/dL Urobilinogen: 0.2 mg/dL    Nitrites: Neg    Leukocyte Esterase: Neg leu/uL    ASSESSMENT:      ICD-10 Details  1 GU:   History of urolithiasis - Z87.442 Chronic, Exacerbation  2   Renal and ureteral calculus - N20.2 Chronic, Exacerbation   PLAN:           Orders Labs Urine Culture          Document Letter(s):  Created for Patient: Clinical Summary         Notes:   Overall the patient is doing well today. We discussed his upcoming surgery, the manner in which it would take place with associated risks, stent placement and postoperative expectations. We talked about medications such as hyoscyamine and phenazopyridine that could be used to manage bothersome symptoms associated with the stent after surgery.

## 2023-12-13 ENCOUNTER — Other Ambulatory Visit (HOSPITAL_BASED_OUTPATIENT_CLINIC_OR_DEPARTMENT_OTHER): Payer: Self-pay

## 2023-12-13 ENCOUNTER — Ambulatory Visit (HOSPITAL_COMMUNITY): Admitting: Certified Registered Nurse Anesthetist

## 2023-12-13 ENCOUNTER — Encounter (HOSPITAL_COMMUNITY): Payer: Self-pay | Admitting: Urology

## 2023-12-13 ENCOUNTER — Ambulatory Visit (HOSPITAL_COMMUNITY)

## 2023-12-13 ENCOUNTER — Ambulatory Visit (HOSPITAL_COMMUNITY)
Admission: RE | Admit: 2023-12-13 | Discharge: 2023-12-13 | Disposition: A | Discharge status: Home / Self Care | Source: Ambulatory Visit | Attending: Urology | Admitting: Urology

## 2023-12-13 ENCOUNTER — Encounter (HOSPITAL_COMMUNITY)
Admission: RE | Disposition: A | Discharge status: Home / Self Care | Payer: Self-pay | Source: Ambulatory Visit | Attending: Urology

## 2023-12-13 DIAGNOSIS — R310 Gross hematuria: Secondary | ICD-10-CM | POA: Diagnosis not present

## 2023-12-13 DIAGNOSIS — Z933 Colostomy status: Secondary | ICD-10-CM | POA: Insufficient documentation

## 2023-12-13 DIAGNOSIS — Z85038 Personal history of other malignant neoplasm of large intestine: Secondary | ICD-10-CM | POA: Diagnosis not present

## 2023-12-13 DIAGNOSIS — E119 Type 2 diabetes mellitus without complications: Secondary | ICD-10-CM | POA: Insufficient documentation

## 2023-12-13 DIAGNOSIS — I10 Essential (primary) hypertension: Secondary | ICD-10-CM | POA: Insufficient documentation

## 2023-12-13 DIAGNOSIS — N210 Calculus in bladder: Secondary | ICD-10-CM | POA: Diagnosis not present

## 2023-12-13 DIAGNOSIS — N202 Calculus of kidney with calculus of ureter: Secondary | ICD-10-CM | POA: Diagnosis present

## 2023-12-13 DIAGNOSIS — N401 Enlarged prostate with lower urinary tract symptoms: Secondary | ICD-10-CM | POA: Insufficient documentation

## 2023-12-13 DIAGNOSIS — G4730 Sleep apnea, unspecified: Secondary | ICD-10-CM | POA: Diagnosis not present

## 2023-12-13 DIAGNOSIS — Z7984 Long term (current) use of oral hypoglycemic drugs: Secondary | ICD-10-CM | POA: Diagnosis not present

## 2023-12-13 LAB — GLUCOSE, CAPILLARY: Glucose-Capillary: 125 mg/dL — ABNORMAL HIGH (ref 70–99)

## 2023-12-13 SURGERY — CYSTOSCOPY/URETEROSCOPY/HOLMIUM LASER/STENT PLACEMENT
Anesthesia: General | Laterality: Left

## 2023-12-13 MED ORDER — LIDOCAINE HCL (PF) 2 % IJ SOLN
INTRAMUSCULAR | Status: DC | PRN
  Administered 2023-12-13: 60 mg via INTRADERMAL

## 2023-12-13 MED ORDER — LIDOCAINE HCL (PF) 2 % IJ SOLN
INTRAMUSCULAR | Status: AC
  Filled 2023-12-13: qty 5

## 2023-12-13 MED ORDER — DEXAMETHASONE SODIUM PHOSPHATE 10 MG/ML IJ SOLN
INTRAMUSCULAR | Status: AC
Start: 2023-12-13 — End: 2023-12-13
  Filled 2023-12-13: qty 1

## 2023-12-13 MED ORDER — SODIUM CHLORIDE 0.9 % IR SOLN
Status: DC | PRN
  Administered 2023-12-13: 3000 mL via INTRAVESICAL

## 2023-12-13 MED ORDER — CIPROFLOXACIN HCL 500 MG PO TABS
500.0000 mg | ORAL_TABLET | Freq: Once | ORAL | 0 refills | Status: AC
  Filled 2023-12-13: qty 1, 1d supply, fill #0

## 2023-12-13 MED ORDER — FENTANYL CITRATE (PF) 100 MCG/2ML IJ SOLN
INTRAMUSCULAR | Status: DC | PRN
  Administered 2023-12-13 (×2): 50 ug via INTRAVENOUS

## 2023-12-13 MED ORDER — TRAMADOL HCL 50 MG PO TABS
50.0000 mg | ORAL_TABLET | Freq: Four times a day (QID) | ORAL | 0 refills | Status: AC | PRN
  Filled 2023-12-13: qty 20, 5d supply, fill #0

## 2023-12-13 MED ORDER — ORAL CARE MOUTH RINSE
15.0000 mL | Freq: Once | OROMUCOSAL | Status: AC
  Administered 2023-12-13: 15 mL via OROMUCOSAL

## 2023-12-13 MED ORDER — TRAMADOL HCL 50 MG PO TABS: 50.0000 mg | ORAL_TABLET | Freq: Four times a day (QID) | ORAL | 0 refills | Status: DC | PRN

## 2023-12-13 MED ORDER — PROPOFOL 10 MG/ML IV BOLUS
INTRAVENOUS | Status: DC | PRN
  Administered 2023-12-13: 200 mg via INTRAVENOUS
  Administered 2023-12-13: 50 mg via INTRAVENOUS

## 2023-12-13 MED ORDER — ONDANSETRON HCL 4 MG/2ML IJ SOLN
INTRAMUSCULAR | Status: AC
  Filled 2023-12-13: qty 2

## 2023-12-13 MED ORDER — ONDANSETRON HCL 4 MG/2ML IJ SOLN
INTRAMUSCULAR | Status: DC | PRN
  Administered 2023-12-13: 4 mg via INTRAVENOUS

## 2023-12-13 MED ORDER — DROPERIDOL 2.5 MG/ML IJ SOLN: 0.6250 mg | Freq: Once | INTRAMUSCULAR | Status: DC | PRN

## 2023-12-13 MED ORDER — CHLORHEXIDINE GLUCONATE 0.12 % MT SOLN: 15.0000 mL | Freq: Once | OROMUCOSAL | Status: AC

## 2023-12-13 MED ORDER — DEXAMETHASONE SODIUM PHOSPHATE 10 MG/ML IJ SOLN
INTRAMUSCULAR | Status: DC | PRN
  Administered 2023-12-13: 10 mg via INTRAVENOUS

## 2023-12-13 MED ORDER — IOHEXOL 300 MG/ML  SOLN
INTRAMUSCULAR | Status: DC | PRN
  Administered 2023-12-13: 10 mL

## 2023-12-13 MED ORDER — PROPOFOL 10 MG/ML IV BOLUS
INTRAVENOUS | Status: AC
  Filled 2023-12-13: qty 20

## 2023-12-13 MED ORDER — FENTANYL CITRATE (PF) 100 MCG/2ML IJ SOLN
INTRAMUSCULAR | Status: AC
  Filled 2023-12-13: qty 2

## 2023-12-13 MED ORDER — LACTATED RINGERS IV SOLN: INTRAVENOUS | Status: DC

## 2023-12-13 MED ORDER — FENTANYL CITRATE PF 50 MCG/ML IJ SOSY: 25.0000 ug | PREFILLED_SYRINGE | INTRAMUSCULAR | Status: DC | PRN

## 2023-12-13 MED ORDER — CIPROFLOXACIN IN D5W 400 MG/200ML IV SOLN
400.0000 mg | INTRAVENOUS | Status: AC
  Administered 2023-12-13: 400 mg via INTRAVENOUS
  Filled 2023-12-13: qty 200

## 2023-12-13 MED ORDER — ACETAMINOPHEN 10 MG/ML IV SOLN
1000.0000 mg | Freq: Once | INTRAVENOUS | Status: DC | PRN
Start: 2023-12-13 — End: 2023-12-13

## 2023-12-13 SURGICAL SUPPLY — 19 items
BAG URO CATCHER STRL LF (MISCELLANEOUS) ×1 IMPLANT
BASKET ZERO TIP NITINOL 2.4FR (BASKET) IMPLANT
CATH URETL OPEN 5X70 (CATHETERS) ×1 IMPLANT
CLOTH BEACON ORANGE TIMEOUT ST (SAFETY) ×1 IMPLANT
DRSG TEGADERM 2-3/8X2-3/4 SM (GAUZE/BANDAGES/DRESSINGS) IMPLANT
FIBER LASER MOSES 200 DFL (Laser) IMPLANT
GLOVE BIO SURGEON STRL SZ 6.5 (GLOVE) ×1 IMPLANT
GOWN STRL REUS W/ TWL LRG LVL3 (GOWN DISPOSABLE) ×1 IMPLANT
GUIDEWIRE STR DUAL SENSOR (WIRE) ×1 IMPLANT
KIT TURNOVER KIT A (KITS) ×1 IMPLANT
MANIFOLD NEPTUNE II (INSTRUMENTS) ×1 IMPLANT
PACK CYSTO (CUSTOM PROCEDURE TRAY) ×1 IMPLANT
SHEATH NAVIGATOR HD 11/13X28 (SHEATH) IMPLANT
SHEATH NAVIGATOR HD 11/13X36 (SHEATH) IMPLANT
STENT URET 6FRX26 CONTOUR (STENTS) IMPLANT
SYRINGE TOOMEY IRRIG 70ML (MISCELLANEOUS) IMPLANT
TRACTIP FLEXIVA PULS ID 200XHI (Laser) IMPLANT
TUBING CONNECTING 10 (TUBING) ×1 IMPLANT
TUBING UROLOGY SET (TUBING) ×1 IMPLANT

## 2023-12-13 NOTE — Anesthesia Procedure Notes (Signed)
 Procedure Name: LMA Insertion Date/Time: 12/13/2023 2:08 PM  Performed by: Joshua Vernell BROCKS, CRNAPre-anesthesia Checklist: Patient identified, Emergency Drugs available, Suction available and Patient being monitored Patient Re-evaluated:Patient Re-evaluated prior to induction Oxygen Delivery Method: Circle system utilized Preoxygenation: Pre-oxygenation with 100% oxygen Induction Type: IV induction Ventilation: Mask ventilation without difficulty LMA: LMA flexible inserted LMA Size: 5.0 Number of attempts: 1 Placement Confirmation: positive ETCO2 and breath sounds checked- equal and bilateral Tube secured with: Tape Dental Injury: Teeth and Oropharynx as per pre-operative assessment

## 2023-12-13 NOTE — Op Note (Signed)
 Preoperative diagnosis: left ureteral calculus  Postoperative diagnosis: bladder calculus  Procedure:  Cystoscopy left diagnotic ureteroscopy left 80F x 26cm ureteral stent placement - with string left retrograde pyelography with interpretation Cystolithotripsy  Surgeon: Valli Shank, MD  Anesthesia: General  Complications: None  Intraoperative findings:  Normal urethra Bilateral lobe hypertrophy prostatic urethra Bilateral orthotropic ureteral orifices Bladder mucosa normal without masses.  Bladder calculus seen. Left retrograde pyelogram did not demonstrate filling defect in the ureter or renal pelvis  EBL: Minimal  Specimens: Bladder calculus  Disposition of specimens: [facility] for stone analysis  Indication: Stephen Tanner is a 53 y.o.   patient with a 9mm left  UVJ calculus as well as renal calculus found on gross hematuria evaluation. After reviewing the management options for treatment, the patient elected to proceed with the above surgical procedure(s). We have discussed the potential benefits and risks of the procedure, side effects of the proposed treatment, the likelihood of the patient achieving the goals of the procedure, and any potential problems that might occur during the procedure or recuperation. Informed consent has been obtained.   Description of procedure:  The patient was taken to the operating room and general anesthesia was induced.  The patient was placed in the dorsal lithotomy position, prepped and draped in the usual sterile fashion, and preoperative antibiotics were administered. A preoperative time-out was performed.   Cystourethroscopy was performed.  The patient's urethra was examined and demonstrated bilobar prostatic hypertrophy.  The bladder was then systematically examined in its entirety.  There was approximately 1 cm bladder stone seen in the dependent portion of the bladder.  Attention then turned to the left  ureteral orifice and a ureteral catheter was used to intubate the ureteral orifice.  Omnipaque  contrast was injected through the ureteral catheter and a retrograde pyelogram was performed with findings as dictated above.  A 0.38 sensor guidewire was then advanced up the left ureter into the renal pelvis under fluoroscopic guidance. The 4.5 Fr semirigid ureteroscope was then advanced into the ureter next to the guidewire.  Due to some tortuosity in the distal ureter a second wire was advanced through the ureteroscope.  Ureteroscope was removed and 1 wire was secured as a safety wire.  A ureteral access sheath was placed over the working wire into the distal ureter.  Flexible ureteroscopy then took place.  There were no stones encountered in the ureter.  There is a clip visible on fluoroscopy that is outside the ureter and not visible endoscopically.  Flexible ureteroscopy continued up to the kidney.  There were no stones seen on inspection.  The ureteral access sheath was then removed in unison with the ureteroscope taking care to examine the ureter on the way out.  Reinspection of the ureter revealed no remaining visible stones or trauma.  Attention then turned to the bladder calculus.  A 365 holmium laser fiber was then used to fragment the stone.  It was removed through the sheath of the cystoscope.The wire was then backloaded through the cystoscope and a ureteral stent was advance over the wire using Seldinger technique.  The stent was positioned appropriately under fluoroscopic and cystoscopic guidance.  The wire was then removed with an adequate stent curl noted in the renal pelvis as well as in the bladder.  The bladder was then emptied and the procedure ended.  The patient appeared to tolerate the procedure well and without complications.  The patient was able to be awakened and transferred to the recovery  unit in satisfactory condition.   Disposition: The tether of the stent was left on and secured  to the ventral aspect of the patient's penis.  Instructions for removing the stent have been provided to the patient.

## 2023-12-13 NOTE — Anesthesia Preprocedure Evaluation (Signed)
 Anesthesia Evaluation  Patient identified by MRN, date of birth, ID band Patient awake    Reviewed: Allergy & Precautions, NPO status , Patient's Chart, lab work & pertinent test results  Airway Mallampati: II  TM Distance: >3 FB Neck ROM: Full    Dental no notable dental hx.    Pulmonary sleep apnea , former smoker   Pulmonary exam normal        Cardiovascular hypertension, Pt. on medications  Rhythm:Regular Rate:Normal     Neuro/Psych  Headaches  negative psych ROS   GI/Hepatic negative GI ROS, Neg liver ROS,,,  Endo/Other  diabetes, Type 2, Oral Hypoglycemic Agents    Renal/GU      Musculoskeletal  (+) Arthritis , Osteoarthritis,    Abdominal Normal abdominal exam  (+)   Peds  Hematology Lab Results      Component                Value               Date                      WBC                      5.7                 12/02/2023                HGB                      15.7                12/02/2023                HCT                      44.9                12/02/2023                MCV                      90.2                12/02/2023                PLT                      195                 12/02/2023             Lab Results      Component                Value               Date                      NA                       134 (L)             12/02/2023                K  4.3                 12/02/2023                CO2                      21 (L)              12/02/2023                GLUCOSE                  141 (H)             12/02/2023                BUN                      20                  12/02/2023                CREATININE               0.71                12/02/2023                CALCIUM                   9.1                 12/02/2023                GFR                      91.91               09/05/2023                GFRNONAA                 >60                  12/02/2023              Anesthesia Other Findings   Reproductive/Obstetrics                              Anesthesia Physical Anesthesia Plan  ASA: 2  Anesthesia Plan: General   Post-op Pain Management:    Induction: Intravenous  PONV Risk Score and Plan: 2 and Ondansetron , Dexamethasone , Midazolam and Treatment may vary due to age or medical condition  Airway Management Planned: Mask and LMA  Additional Equipment: None  Intra-op Plan:   Post-operative Plan: Extubation in OR  Informed Consent: I have reviewed the patients History and Physical, chart, labs and discussed the procedure including the risks, benefits and alternatives for the proposed anesthesia with the patient or authorized representative who has indicated his/her understanding and acceptance.     Dental advisory given  Plan Discussed with: CRNA  Anesthesia Plan Comments:         Anesthesia Quick Evaluation

## 2023-12-13 NOTE — Anesthesia Postprocedure Evaluation (Signed)
 Anesthesia Post Note  Patient: Stephen Tanner  Procedure(s) Performed: CYSTOSCOPY/URETEROSCOPY/HOLMIUM LASER/STENT PLACEMENT (Left)     Patient location during evaluation: PACU Anesthesia Type: General Level of consciousness: awake and alert Pain management: pain level controlled Vital Signs Assessment: post-procedure vital signs reviewed and stable Respiratory status: spontaneous breathing, nonlabored ventilation, respiratory function stable and patient connected to nasal cannula oxygen Cardiovascular status: blood pressure returned to baseline and stable Postop Assessment: no apparent nausea or vomiting Anesthetic complications: no   No notable events documented.  Last Vitals:  Vitals:   12/13/23 1525 12/13/23 1532  BP: 134/88 (!) 141/87  Pulse: 77 78  Resp: 17 16  Temp:    SpO2: 95% 98%    Last Pain:  Vitals:   12/13/23 1532  TempSrc:   PainSc: 2                  Cordella SQUIBB Jamaine Quintin

## 2023-12-13 NOTE — Interval H&P Note (Signed)
 History and Physical Interval Note:  12/13/2023 1:34 PM  Stephen Tanner  has presented today for surgery, with the diagnosis of LEFT URETERAL AND RENAL CALCULI.  The various methods of treatment have been discussed with the patient and family. After consideration of risks, benefits and other options for treatment, the patient has consented to  Procedure(s) with comments: CYSTOSCOPY/URETEROSCOPY/HOLMIUM LASER/STENT PLACEMENT (Left) - CYSTOSCOPY/LEFT URETEROSCOPY/HOLMIUM LASER/STENT PLACEMENT/RETROGRADE PYELOGRAM as a surgical intervention.  The patient's history has been reviewed, patient examined, no change in status, stable for surgery.  I have reviewed the patient's chart and labs.  Questions were answered to the patient's satisfaction.     Raileigh Sabater D Alisson Rozell

## 2023-12-13 NOTE — Transfer of Care (Signed)
 Immediate Anesthesia Transfer of Care Note  Patient: Stephen Tanner  Procedure(s) Performed: CYSTOSCOPY/URETEROSCOPY/HOLMIUM LASER/STENT PLACEMENT (Left)  Patient Location: PACU  Anesthesia Type:General  Level of Consciousness: awake and patient cooperative  Airway & Oxygen Therapy: Patient Spontanous Breathing  Post-op Assessment: Report given to RN and Post -op Vital signs reviewed and stable  Post vital signs: Reviewed and stable  Last Vitals:  Vitals Value Taken Time  BP 141/86 12/13/23 14:54  Temp    Pulse 76 12/13/23 14:57  Resp 20 12/13/23 14:57  SpO2 95 % 12/13/23 14:57  Vitals shown include unfiled device data.  Last Pain:  Vitals:   12/13/23 1230  TempSrc:   PainSc: 5          Complications: No notable events documented.

## 2023-12-13 NOTE — Discharge Instructions (Signed)
 DISCHARGE INSTRUCTIONS FOR KIDNEY STONE/URETERAL STENT   MEDICATIONS:  1. Resume all your other meds from home  2. AZO over the counter can help with the burning/stinging when you urinate. 3. Tramadol  is for moderate/severe pain, otherwise taking up to 1000 mg every 6 hours of plainTylenol will help treat your pain.   4. Take Cipro  one hour prior to removal of your stent.    ACTIVITY:  1. No strenuous activity x 1week  2. No driving while on narcotic pain medications  3. Drink plenty of water  4. Continue to walk at home - you can still get blood clots when you are at home, so keep active, but don't over do it.  5. May return to work/school tomorrow or when you feel ready   BATHING:  1. You can shower and we recommend daily showers  2. You have a string coming from your urethra: The stent string is attached to your ureteral stent. Do not pull on this.   SIGNS/SYMPTOMS TO CALL:  Please call us  if you have a fever greater than 101.5, uncontrolled nausea/vomiting, uncontrolled pain, dizziness, unable to urinate, bloody urine, chest pain, shortness of breath, leg swelling, leg pain, redness around wound, drainage from wound, or any other concerns or questions.   You can reach us  at [phone].   FOLLOW-UP:  1. You have a string attached to your stent, you may remove it on Friday, July 25th. To do this, pull the string until the stent is completely removed. You may feel an odd sensation in your back.

## 2023-12-14 ENCOUNTER — Encounter (HOSPITAL_COMMUNITY): Payer: Self-pay | Admitting: Urology

## 2023-12-15 ENCOUNTER — Other Ambulatory Visit: Payer: Self-pay

## 2023-12-15 ENCOUNTER — Other Ambulatory Visit: Payer: Self-pay | Admitting: Family Medicine

## 2023-12-15 ENCOUNTER — Other Ambulatory Visit (HOSPITAL_BASED_OUTPATIENT_CLINIC_OR_DEPARTMENT_OTHER): Payer: Self-pay

## 2023-12-15 MED ORDER — CIPROFLOXACIN HCL 500 MG PO TABS
500.0000 mg | ORAL_TABLET | Freq: Every day | ORAL | 0 refills | Status: AC
  Filled 2023-12-15: qty 2, 2d supply, fill #0

## 2023-12-16 ENCOUNTER — Other Ambulatory Visit: Payer: Self-pay

## 2023-12-16 ENCOUNTER — Other Ambulatory Visit (HOSPITAL_BASED_OUTPATIENT_CLINIC_OR_DEPARTMENT_OTHER): Payer: Self-pay

## 2023-12-16 MED ORDER — LISINOPRIL 40 MG PO TABS
40.0000 mg | ORAL_TABLET | Freq: Every day | ORAL | 3 refills | Status: AC
  Filled 2023-12-16: qty 30, 30d supply, fill #0
  Filled 2024-01-09 – 2024-01-25 (×2): qty 30, 30d supply, fill #1
  Filled 2024-02-28: qty 30, 30d supply, fill #2
  Filled 2024-04-04 (×2): qty 30, 30d supply, fill #3
  Filled 2024-05-06: qty 30, 30d supply, fill #4
  Filled 2024-06-20: qty 30, 30d supply, fill #5

## 2023-12-20 ENCOUNTER — Other Ambulatory Visit (HOSPITAL_BASED_OUTPATIENT_CLINIC_OR_DEPARTMENT_OTHER): Payer: Self-pay

## 2023-12-20 MED ORDER — SILODOSIN 8 MG PO CAPS
8.0000 mg | ORAL_CAPSULE | Freq: Every day | ORAL | 11 refills | Status: AC
  Filled 2023-12-20: qty 30, 30d supply, fill #0
  Filled 2024-01-09 – 2024-01-25 (×2): qty 30, 30d supply, fill #1

## 2024-01-03 ENCOUNTER — Other Ambulatory Visit (HOSPITAL_BASED_OUTPATIENT_CLINIC_OR_DEPARTMENT_OTHER): Payer: Self-pay

## 2024-01-10 ENCOUNTER — Other Ambulatory Visit: Payer: Self-pay

## 2024-01-16 ENCOUNTER — Other Ambulatory Visit (HOSPITAL_BASED_OUTPATIENT_CLINIC_OR_DEPARTMENT_OTHER): Payer: Self-pay

## 2024-01-20 ENCOUNTER — Other Ambulatory Visit (HOSPITAL_BASED_OUTPATIENT_CLINIC_OR_DEPARTMENT_OTHER): Payer: Self-pay

## 2024-01-25 ENCOUNTER — Other Ambulatory Visit (HOSPITAL_BASED_OUTPATIENT_CLINIC_OR_DEPARTMENT_OTHER): Payer: Self-pay

## 2024-02-03 ENCOUNTER — Other Ambulatory Visit (HOSPITAL_BASED_OUTPATIENT_CLINIC_OR_DEPARTMENT_OTHER): Payer: Self-pay

## 2024-02-03 MED ORDER — TADALAFIL 10 MG PO TABS
10.0000 mg | ORAL_TABLET | ORAL | 3 refills | Status: AC | PRN
  Filled 2024-02-03 – 2024-02-21 (×2): qty 15, 30d supply, fill #0

## 2024-02-03 MED ORDER — TADALAFIL 5 MG PO TABS
5.0000 mg | ORAL_TABLET | Freq: Every day | ORAL | 3 refills | Status: AC
  Filled 2024-02-03: qty 30, 30d supply, fill #0
  Filled 2024-03-03 (×2): qty 30, 30d supply, fill #1
  Filled 2024-03-27: qty 30, 30d supply, fill #2
  Filled 2024-05-06: qty 30, 30d supply, fill #3
  Filled 2024-06-04: qty 30, 30d supply, fill #4

## 2024-02-06 ENCOUNTER — Other Ambulatory Visit: Payer: Self-pay

## 2024-02-16 ENCOUNTER — Other Ambulatory Visit (HOSPITAL_BASED_OUTPATIENT_CLINIC_OR_DEPARTMENT_OTHER): Payer: Self-pay

## 2024-02-21 ENCOUNTER — Other Ambulatory Visit (HOSPITAL_BASED_OUTPATIENT_CLINIC_OR_DEPARTMENT_OTHER): Payer: Self-pay

## 2024-03-03 ENCOUNTER — Other Ambulatory Visit (HOSPITAL_BASED_OUTPATIENT_CLINIC_OR_DEPARTMENT_OTHER): Payer: Self-pay

## 2024-04-03 ENCOUNTER — Other Ambulatory Visit (HOSPITAL_BASED_OUTPATIENT_CLINIC_OR_DEPARTMENT_OTHER): Payer: Self-pay

## 2024-04-04 ENCOUNTER — Other Ambulatory Visit (HOSPITAL_BASED_OUTPATIENT_CLINIC_OR_DEPARTMENT_OTHER): Payer: Self-pay

## 2024-04-04 ENCOUNTER — Other Ambulatory Visit: Payer: Self-pay

## 2024-06-07 ENCOUNTER — Other Ambulatory Visit (HOSPITAL_BASED_OUTPATIENT_CLINIC_OR_DEPARTMENT_OTHER): Payer: Self-pay

## 2024-06-25 ENCOUNTER — Encounter (HOSPITAL_BASED_OUTPATIENT_CLINIC_OR_DEPARTMENT_OTHER): Payer: Self-pay

## 2024-06-26 ENCOUNTER — Other Ambulatory Visit (HOSPITAL_BASED_OUTPATIENT_CLINIC_OR_DEPARTMENT_OTHER): Payer: Self-pay
